# Patient Record
Sex: Male | Born: 1950 | Race: White | Hispanic: No | Marital: Single | Smoking: Former smoker
Health system: Southern US, Community
[De-identification: ages and names within clinical notes are randomized; demographics above are authoritative.]

## PROBLEM LIST (undated history)

## (undated) HISTORY — PX: OTHER SURGICAL HISTORY: SHX169

## (undated) HISTORY — PX: CARPAL TUNNEL RELEASE: SHX101

## (undated) NOTE — *Deleted (*Deleted)
PROGRESS NOTE  John Houston ZOX:[id number] DOB: 25-Jul-1951 DOA: 07/02/2020 PCP: Rodrigo Ran, MD   LOS: 5 days   Brief narrative: As per HPI,  John Houston a69 y.o.malewith a history of prior hospitalization for MRSA and recent covid-19 diagnosis who presented to the ED 07/02/20 with continued progressive dyspnea for 1-2 weeks. On arrival to the ED, SpO2 was 78% on room air, placed on 8L HFNC. CXR revealed multifocal patchy opacities confirmed on CTA chest which showed no PE. Remdesivir and steroids started, and the patient was admitted to [facility]  for acute hypoxic respiratory failure due to covid-19 pneumonia.  Assessment/Plan:  Principal Problem:   Pneumonia due to COVID-19 virus Active Problems:   Leukocytosis   Acute respiratory failure with hypoxia (HCC)   Acute hypoxemic respiratory failure due to covid-19 pneumonia:  Patient tested positive on positive on 10/27, on remdesivir day 5.5.  Continue Solu-Medrol IV high-dose.  Continue baricitinib.  Continue to wean oxygen as able. Continue airborne, contact precautions for 21 days from positive testing.  Continue to monitor inflammatory markers.  Currently on 3 L of oxygen by nasal cannula. Trending down inflammatory biomarkers.  COVID-19 Labs  Recent Labs    07/05/20 0348 07/06/20 0514 07/07/20 0337  DDIMER 0.83* 0.69* 0.58*  FERRITIN 994* 968* 893*  CRP 5.3* 2.8* 1.6*    Lab Results  Component Value Date   SARSCOV2NAA POSITIVE (A) 07/02/2020   SARSCOV2NAA NEGATIVE 11/01/2019    Elevated LFTs.  Mildly elevated.  We will continue to monitor.: Likely due to covid.   Leukocytosis Likely reactive related to steroid use.   Check CBC in a.m.  DVT prophylaxis: enoxaparin (LOVENOX) injection 40 mg Start: 07/03/20 1000    Code Status: Full code  Family Communication: Spoke with the patient's daughter on phone and updated her about the clinical condition of the patient and the plan for disposition home  today.  Status is: Inpatient  Remains inpatient appropriate because:Inpatient level of care appropriate due to severity of illness   Dispo: The patient is from: Home              Anticipated d/c is to: Home              Anticipated d/c date is: 2 days              Patient currently is not medically stable to d/c.       Consultants:  None  Procedures:  None  Antibiotics:  . Remdesivir  Anti-infectives (From admission, onward)   Start     Dose/Rate Route Frequency Ordered Stop   07/04/20 1000  remdesivir 100 mg in sodium chloride 0.9 % 100 mL IVPB       "Followed by" Linked Group Details   100 mg 200 mL/hr over 30 Minutes Intravenous Daily 07/03/20 0001 07/08/20 0959   07/03/20 0100  remdesivir 200 mg in sodium chloride 0.9% 250 mL IVPB       "Followed by" Linked Group Details   200 mg 580 mL/hr over 30 Minutes Intravenous Once 07/03/20 0001 07/03/20 0114       Subjective: Today, patient was seen and examined at bedside. ??  Objective: Vitals:   07/06/20 2131 07/07/20 0533  BP: 130/75 131/86  Pulse: 82 72  Resp: 20 18  Temp: 97.9 F (36.6 C) 98 F (36.7 C)  SpO2: 97% 97%    Intake/Output Summary (Last 24 hours) at 07/07/2020 0829 Last data filed at 07/06/2020 0900 Gross per 24  hour  Intake 240 ml  Output -  Net 240 ml   Filed Weights   07-03-2020 1712  Weight: 79.4 kg   Body mass index is 25.84 kg/m.   Physical Exam: ?? GENERAL: Patient is alert awake and oriented. Not in obvious distress.  HENT: No scleral pallor or icterus. Pupils equally reactive to light. Oral mucosa is moist NECK: is supple, no gross swelling noted. CHEST:  CVS: S1 and S2 heard, no murmur. Regular rate and rhythm.  ABDOMEN: Soft, non-tender, bowel sounds are present. EXTREMITIES: No edema. CNS: Cranial nerves are intact. No focal motor deficits. SKIN: warm and dry without rashes.  Data Review: I have personally reviewed the following laboratory data and studies,   CBC: Recent Labs  Lab 07/03/2020 1736 07/03/20 0031 07/04/20 0327 07/06/20 0514  WBC 17.3* 15.4* 24.3* 23.4*  NEUTROABS 12.4* 10.8* 19.3* 20.0*  HGB 15.2 14.3 14.3 13.0  HCT 44.7 43.3 43.5 38.6*  MCV 94.9 98.6 98.9 98.2  PLT 323 309 311 365   Basic Metabolic Panel: Recent Labs  Lab 07/03/20 0031 07/04/20 0327 07/05/20 0348 07/06/20 0514 07/07/20 0337  NA 136 136 143 140 140  K 4.9 4.4 5.0 4.8 4.7  CL 101 106 109 106 103  CO2 25 19* 24 27 25   GLUCOSE 112* 160* 151* 145* 191*  BUN 19 19 24* 24* 25*  CREATININE 1.23 0.93 0.92 0.91 1.00  CALCIUM 8.7* 8.9 9.2 9.0 9.0   Liver Function Tests: Recent Labs  Lab 07/03/20 0031 07/04/20 0327 07/05/20 0348 07/06/20 0514 07/07/20 0337  AST 52* 49* 41 50* 42*  ALT 73* 84* 93* 120* 118*  ALKPHOS 61 57 48 49 47  BILITOT 1.8* 1.1 1.0 0.9 0.9  PROT 7.0 7.1 6.6 6.5 6.4*  ALBUMIN 3.1* 3.0* 2.8* 2.9* 3.0*   No results for input(s): LIPASE, AMYLASE in the last 168 hours. No results for input(s): AMMONIA in the last 168 hours. Cardiac Enzymes: No results for input(s): CKTOTAL, CKMB, CKMBINDEX, TROPONINI in the last 168 hours. BNP (last 3 results) No results for input(s): BNP in the last 8760 hours.  ProBNP (last 3 results) No results for input(s): PROBNP in the last 8760 hours.  CBG: No results for input(s): GLUCAP in the last 168 hours. Recent Results (from the past 240 hour(s))  Respiratory Panel by RT PCR (Flu A&B, Covid) - Nasopharyngeal Swab     Status: Abnormal   Collection Time: 07-03-2020  5:36 PM   Specimen: Nasopharyngeal Swab  Result Value Ref Range Status   SARS Coronavirus 2 by RT PCR POSITIVE (A) NEGATIVE Final    Comment: RESULT CALLED TO, READ BACK BY AND VERIFIED WITH: April Holding, RN @ [phone] ON July 03, 2020, CABELL[place].P (NOTE) SARS-CoV-2 target nucleic acids are DETECTED.  SARS-CoV-2 RNA is generally detectable in upper respiratory specimens  during the acute phase of infection. Positive results are  indicative of the presence of the identified virus, but do not rule out bacterial infection or co-infection with other pathogens not detected by the test. Clinical correlation with patient history and other diagnostic information is necessary to determine patient infection status. The expected result is Negative.  Fact Sheet for Patients:  https://www.moore.com/  Fact Sheet for Healthcare Providers: https://www.young.biz/  This test is not yet approved or cleared by the [place] FDA and  has been authorized for detection and/or diagnosis of SARS-CoV-2 by FDA under an Emergency Use Authorization (EUA).  This EUA will remain in effect (meaning t his test can be  used) for the duration of  the COVID-19 declaration under Section 564(b)(1) of the Act, 21 U.S.C. section 360bbb-3(b)(1), unless the authorization is terminated or revoked sooner.      Influenza A by PCR NEGATIVE NEGATIVE Final   Influenza B by PCR NEGATIVE NEGATIVE Final    Comment: (NOTE) The Xpert Xpress SARS-CoV-2/FLU/RSV assay is intended as an aid in  the diagnosis of influenza from Nasopharyngeal swab specimens and  should not be used as a sole basis for treatment. Nasal washings and  aspirates are unacceptable for Xpert Xpress SARS-CoV-2/FLU/RSV  testing.  Fact Sheet for Patients: https://www.moore.com/  Fact Sheet for Healthcare Providers: https://www.young.biz/  This test is not yet approved or cleared by the [place] FDA and  has been authorized for detection and/or diagnosis of SARS-CoV-2 by  FDA under an Emergency Use Authorization (EUA). This EUA will remain  in effect (meaning this test can be used) for the duration of the  Covid-19 declaration under Section 564(b)(1) of the Act, 21  U.S.C. section 360bbb-3(b)(1), unless the authorization is  terminated or revoked. Performed at [facility], [address] [place], [place] [address]   Blood Culture (routine x 2)     Status: None (Preliminary result)   Collection Time: 07/02/20  5:37 PM   Specimen: BLOOD  Result Value Ref Range Status   Specimen Description   Final    BLOOD RIGHT ANTECUBITAL Performed at [facility], [address] [place], [place] [address]    Special Requests   Final    BOTTLES DRAWN AEROBIC AND ANAEROBIC Blood Culture adequate volume Performed at [facility], [address] [place], [place] [address]    Culture   Final    NO GROWTH 4 DAYS Performed at [facility], [address], [place], [place] [address]    Report Status PENDING  Incomplete  Blood Culture (routine x 2)     Status: None (Preliminary result)   Collection Time: 07/02/20  5:56 PM   Specimen: BLOOD  Result Value Ref Range Status   Specimen Description   Final    BLOOD BLOOD LEFT FOREARM Performed at [facility], [address] [place], [place] [address]    Special Requests   Final    BOTTLES DRAWN AEROBIC AND ANAEROBIC Blood Culture adequate volume Performed at [facility], [address] [place], [place] [address]    Culture   Final    NO GROWTH 4 DAYS Performed at [facility], [address], [place], [place] [address]    Report Status PENDING  Incomplete     Studies: No results found.    Joycelyn Das, MD  [facility] 07/07/2020

---

## 2006-08-26 ENCOUNTER — Ambulatory Visit (HOSPITAL_BASED_OUTPATIENT_CLINIC_OR_DEPARTMENT_OTHER): Admission: RE | Admit: 2006-08-26 | Discharge: 2006-08-26 | Payer: Self-pay | Admitting: Orthopedic Surgery

## 2007-12-11 ENCOUNTER — Observation Stay (HOSPITAL_COMMUNITY): Admission: EM | Admit: 2007-12-11 | Discharge: 2007-12-12 | Payer: Self-pay | Admitting: Emergency Medicine

## 2011-01-19 NOTE — Discharge Summary (Signed)
John Houston, John Houston              ACCOUNT NO.:  [id number]   MEDICAL RECORD NO.:  [id number]          PATIENT TYPE:  INP   LOCATION:  3731                         FACILITY:  [facility]   PHYSICIAN:  Elmore Guise., M.D.DATE OF BIRTH:  August 26, 1951   DATE OF ADMISSION:  12/11/2007  DATE OF DISCHARGE:  12/12/2007                               DISCHARGE SUMMARY   DISCHARGE DIAGNOSES:  1. Chest pain.  2. Dyslipidemia.  3. Gastroesophageal reflux disease.   HISTORY OF PRESENT ILLNESS:  John Houston is a very pleasant 60 year old  white male who presented with off and on chest pain.   HOSPITAL COURSE:  The patient's hospital course was uncomplicated.  He  ruled out for myocardial infarction and became chest pain-free with the  addition of proton pump inhibitor.  He was noted to have elevated LDL of  147; however, his blood work otherwise was normal.  He is now without  any further chest pain, and he has been up and ambulatory without any  problems.   He will be discharged on the following medications:  1. Aspirin 325 mg daily.  2. Lovastatin 40 mg daily.   He will have followup with Dr. Reyes Ivan at [facility] with an  outpatient stress echo.  If he has any further problems, he is to give  [place] a call for further instructions and evaluation.      Elmore Guise., M.D.  Electronically Signed     TWK/MEDQ  D:  12/12/2007  T:  12/12/2007  Job:  [id number]   cc:   Loraine Leriche A. Perini, M.D.

## 2011-01-19 NOTE — H&P (Signed)
John Houston, John Houston              ACCOUNT NO.:  [id number]   MEDICAL RECORD NO.:  [id number]          PATIENT TYPE:  INP   LOCATION:  1831                         FACILITY:  [facility]   PHYSICIAN:  Elmore Guise., M.D.DATE OF BIRTH:  August 16, 1951   DATE OF ADMISSION:  12/11/2007  DATE OF DISCHARGE:                              HISTORY & PHYSICAL   INDICATION FOR ADMISSION:  Chest pain.   PRIMARY CARE PHYSICIAN:  Dr. Rodrigo Ran.   HISTORY OF PRESENT ILLNESS:  John Houston is a very pleasant 60 year old  white male, past medical history of tobacco use (quit times 1 year),  borderline dyslipidemia, who presents with 3 days of off-and-on chest  pain.  The patient reports his symptoms starting Friday night as  tightness on lying down.  He does report some improvement with activity  that evening; however, his symptoms continued.  He did have off-and-on  symptoms on Saturday and Sunday.  He also reports a nonproductive cough  on Sunday.  He went about his normal activities. However, on Sunday  evening started noticing some radiation to his back and sometimes to the  left arm.  Today, he awoke and just did not feel right.  He also  noticed a little bit of wheezing.  Because of his concerns, he came to  the emergency room for further evaluation.  He denies any nausea,  vomiting or diarrhea.  No fever.  No abdominal pain.  No lower extremity  edema.  No palpitations.  No pre-syncope or syncope.   REVIEW OF SYSTEMS:  As per HPI or otherwise negative.   CURRENT MEDICATIONS:  Are none.   ALLERGIES:  NONE.   FAMILY HISTORY:  Positive for diabetes, liver cancer, and Parkinson's  disease.   SOCIAL HISTORY:  Separated.  He has a history of less than 20 pack-years  of tobacco (quit x1 year), does have occasional alcohol use.   EXAM:  VITAL SIGNS:  He is afebrile.  Blood pressure is 110-120/70,  heart rate 70 to 80s, in normal sinus rhythm.  Sat 95% on room air.  GENERAL:  He is very  pleasant middle-aged white male, alert and oriented  x4.  No acute distress.  HEENT: Is normal.  NECK:  Supple.  No lymphadenopathy, 2+ carotids, no JVD and no bruits.  LUNGS:  Clear with good air movement to the bases.  No wheezes noted.  HEART:  Regular with normal S1-S2, no murmurs, gallops or rubs.  ABDOMEN:  Is soft, nontender, nondistended.  No rebound or guarding.  EXTREMITIES:  Warm with 2+ pulses and no edema.  NEUROLOGICAL:  Cranial nerves are intact and strength is 5/5 and equal  bilaterally.   His blood work shows a white blood cell count of 8.5, hemoglobin of  16.3, platelet count of 141.  He has got a BUN and creatinine of 12 and  1.3 with potassium level 3.5.  His myoglobin is 75, CPK-MB of less than  1.0 and troponin-I less than 0.05.  His ECG shows normal sinus rhythm,  rate of 71 per minute with no significant ST or T-wave changes.  No  change from his prior tracing.  Chest x-ray shows no acute  cardiopulmonary disease.   IMPRESSION:  1. Chest pain with mainly atypical qualities.  2. Borderline dyslipidemia.  3. History of tobacco use.   PLAN:  1. The patient will be admitted for a 24-observation.  We will rule      out myocardial infarction, will start aspirin 325 mg daily.  Start      omeprazole once daily.  He will have Tordol p.r.n. for chest pain.      If his enzymes are negative and he is having no further symptoms,      likely will be able to discharge tomorrow with outpatient stress      echo, done later this week.      Elmore Guise., M.D.  Electronically Signed     TWK/MEDQ  D:  12/11/2007  T:  12/11/2007  Job:  [id number]   cc:   Loraine Leriche A. Perini, M.D.

## 2011-01-22 NOTE — Op Note (Signed)
NAMEDENARD, TUMINELLO              ACCOUNT NO.:  [id number]   MEDICAL RECORD NO.:  [id number]          PATIENT TYPE:  AMB   LOCATION:  DSC                          FACILITY:  [facility]   PHYSICIAN:  Katy Fitch. Sypher, M.D. 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   OPERATION:  Release of right transverse carpal ligament.   SURGEON:  Katy Fitch. Sypher, M.D.   ASSISTANT:  Marveen Reeks. Dasnoit, P.A.-C.   ANESTHESIA:  General by LMA.   SUPERVISING ANESTHESIOLOGIST:  Bedelia Person, M.D.   INDICATIONS:  John Houston is a 60 year old man referred for  evaluation and management of hand numbness.  He is status post a prior  left carpal tunnel release with an excellent result.  He now presents  for release of his right transverse carpal ligament with persistent  discomfort and numbness in the median innervated fingers on the right.  Preoperatively, he was advised of the potential risks and benefits of  surgery.  Questions were invited and answered.   PROCEDURE:  John Houston is brought to the operating room and placed  in supine position on the operating room table.  Following the induction  of general anesthesia by LMA technique, the right arm was prepped with  Betadine soap solution and sterilely draped.  Following exsanguination  of the right arm with an Esmarch bandage, the arterial tourniquet on the  proximal brachium was inflated to 230 mmHg.  The procedure commenced  with a short incision in line of the ring finger and the palm.  The  subcutaneous tissues were carefully divided revealing the palmar fascia.  This was split longitudinally to reveal the common sensory branches of  the median nerve.  These were followed back to the transverse  carpal  ligament which was gently isolated from the median nerve.  The ligament  was then released along its ulnar border extending into the distal  forearm.   This widely opened the carpal canal.  Bleeding points along the margin  of the released ligament were electrocauterized with bipolar current  followed by repair of the skin with intradermal 3-0 Prolene suture.  A  compressive dressing was applied with a volar plaster splint maintaining  the wrist in 5 degrees of dorsiflexion.  There were no apparent  complications.      Katy Fitch Sypher, M.D.  Electronically Signed     RVS/MEDQ  D:  08/26/2006  T:  08/26/2006  Job:  [id number]   cc:   Loraine Leriche A. Perini, M.D.

## 2011-06-01 LAB — DIFFERENTIAL
Basophils Absolute: 0
Basophils Relative: 0
Eosinophils Absolute: 0.2
Lymphs Abs: 2.1
Neutrophils Relative %: 66

## 2011-06-01 LAB — CBC
MCHC: 34.2
MCV: 94.6
Platelets: 141 — ABNORMAL LOW
WBC: 8.5

## 2011-06-01 LAB — POCT CARDIAC MARKERS
CKMB, poc: <1 — ABNORMAL LOW
Myoglobin, poc: 75
Operator id: 151321

## 2011-06-01 LAB — POCT I-STAT, CHEM 8
HCT: 49
Hemoglobin: 16.7
Potassium: 3.5
Sodium: 140

## 2011-06-01 LAB — LIPID PANEL
LDL Cholesterol: 143 — ABNORMAL HIGH
Total CHOL/HDL Ratio: 5.2
Triglycerides: 126
VLDL: 25

## 2012-01-07 ENCOUNTER — Encounter: Payer: Self-pay | Admitting: Internal Medicine

## 2012-03-13 ENCOUNTER — Ambulatory Visit (AMBULATORY_SURGERY_CENTER): Payer: BC Managed Care – PPO | Admitting: *Deleted

## 2012-03-13 VITALS — Ht 70.0 in | Wt 169.0 lb

## 2012-03-13 DIAGNOSIS — Z1211 Encounter for screening for malignant neoplasm of colon: Secondary | ICD-10-CM

## 2012-03-13 MED ORDER — MOVIPREP 100 G PO SOLR: ORAL | Status: DC

## 2012-03-14 ENCOUNTER — Encounter: Payer: Self-pay | Admitting: Internal Medicine

## 2012-03-27 ENCOUNTER — Encounter: Payer: Self-pay | Admitting: Internal Medicine

## 2012-11-17 ENCOUNTER — Encounter (HOSPITAL_BASED_OUTPATIENT_CLINIC_OR_DEPARTMENT_OTHER): Payer: Self-pay | Admitting: *Deleted

## 2012-11-17 ENCOUNTER — Emergency Department (HOSPITAL_BASED_OUTPATIENT_CLINIC_OR_DEPARTMENT_OTHER)
Admission: EM | Admit: 2012-11-17 | Discharge: 2012-11-17 | Disposition: A | Discharge status: Home / Self Care | Payer: BC Managed Care – PPO | Attending: Emergency Medicine | Admitting: Emergency Medicine

## 2012-11-17 DIAGNOSIS — T148XXA Other injury of unspecified body region, initial encounter: Secondary | ICD-10-CM

## 2012-11-17 DIAGNOSIS — IMO0002 Reserved for concepts with insufficient information to code with codable children: Secondary | ICD-10-CM | POA: Insufficient documentation

## 2012-11-17 DIAGNOSIS — Y9389 Activity, other specified: Secondary | ICD-10-CM | POA: Insufficient documentation

## 2012-11-17 DIAGNOSIS — Y92410 Unspecified street and highway as the place of occurrence of the external cause: Secondary | ICD-10-CM | POA: Insufficient documentation

## 2012-11-17 DIAGNOSIS — Z87891 Personal history of nicotine dependence: Secondary | ICD-10-CM | POA: Insufficient documentation

## 2012-11-17 MED ORDER — IBUPROFEN 800 MG PO TABS: 800.0000 mg | ORAL_TABLET | Freq: Three times a day (TID) | ORAL | Status: DC
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
## 2012-11-18 NOTE — ED Provider Notes (Signed)
Medical screening examination/treatment/procedure(s) were performed by non-physician practitioner and as supervising physician I was immediately available for consultation/collaboration.   Alan Davidson III, MD 11/18/12 1239 

## 2012-12-29 ENCOUNTER — Ambulatory Visit (HOSPITAL_COMMUNITY)
Admission: RE | Admit: 2012-12-29 | Discharge: 2012-12-29 | Disposition: A | Discharge status: Home / Self Care | Payer: BC Managed Care – PPO | Source: Ambulatory Visit | Attending: Orthopedic Surgery | Admitting: Orthopedic Surgery

## 2012-12-29 ENCOUNTER — Other Ambulatory Visit (HOSPITAL_COMMUNITY): Payer: Self-pay | Admitting: Orthopedic Surgery

## 2012-12-29 DIAGNOSIS — D140 Benign neoplasm of middle ear, nasal cavity and accessory sinuses: Secondary | ICD-10-CM | POA: Insufficient documentation

## 2012-12-29 DIAGNOSIS — T1590XA Foreign body on external eye, part unspecified, unspecified eye, initial encounter: Secondary | ICD-10-CM | POA: Insufficient documentation

## 2012-12-29 DIAGNOSIS — IMO0002 Reserved for concepts with insufficient information to code with codable children: Secondary | ICD-10-CM

## 2017-04-18 ENCOUNTER — Encounter (HOSPITAL_COMMUNITY): Payer: Self-pay

## 2017-04-18 ENCOUNTER — Emergency Department (HOSPITAL_COMMUNITY): Payer: Medicare Other

## 2017-04-18 ENCOUNTER — Emergency Department (HOSPITAL_COMMUNITY)
Admission: EM | Admit: 2017-04-18 | Discharge: 2017-04-19 | Disposition: A | Discharge status: Home / Self Care | Payer: Medicare Other | Attending: Emergency Medicine | Admitting: Emergency Medicine

## 2017-04-18 DIAGNOSIS — K612 Anorectal abscess: Secondary | ICD-10-CM | POA: Diagnosis not present

## 2017-04-18 DIAGNOSIS — Z87891 Personal history of nicotine dependence: Secondary | ICD-10-CM | POA: Diagnosis not present

## 2017-04-18 DIAGNOSIS — L02215 Cutaneous abscess of perineum: Secondary | ICD-10-CM

## 2017-04-18 DIAGNOSIS — K611 Rectal abscess: Secondary | ICD-10-CM | POA: Diagnosis not present

## 2017-04-18 DIAGNOSIS — Z6825 Body mass index (BMI) 25.0-25.9, adult: Secondary | ICD-10-CM | POA: Diagnosis not present

## 2017-04-18 LAB — COMPREHENSIVE METABOLIC PANEL
ALBUMIN: 4.1 g/dL (ref 3.5–5.0)
ALK PHOS: 58 U/L (ref 38–126)
ALT: 84 U/L — AB (ref 17–63)
AST: 51 U/L — ABNORMAL HIGH (ref 15–41)
Anion gap: 10 (ref 5–15)
BUN: 10 mg/dL (ref 6–20)
CALCIUM: 9.4 mg/dL (ref 8.9–10.3)
CO2: 25 mmol/L (ref 22–32)
CREATININE: 1.07 mg/dL (ref 0.61–1.24)
Chloride: 105 mmol/L (ref 101–111)
GFR calc Af Amer: >60 mL/min (ref >60)
GFR calc non Af Amer: >60 mL/min (ref >60)
GLUCOSE: 114 mg/dL — AB (ref 65–99)
Potassium: 4.3 mmol/L (ref 3.5–5.1)
Sodium: 140 mmol/L (ref 135–145)
Total Bilirubin: 4.5 mg/dL — ABNORMAL HIGH (ref 0.3–1.2)
Total Protein: 7.7 g/dL (ref 6.5–8.1)

## 2017-04-18 LAB — CBC WITH DIFFERENTIAL/PLATELET
BASOS PCT: 0 %
Basophils Absolute: 0 10*3/uL (ref 0.0–0.1)
EOS ABS: 0 10*3/uL (ref 0.0–0.7)
EOS PCT: 0 %
HEMATOCRIT: 44.2 % (ref 39.0–52.0)
Hemoglobin: 15.5 g/dL (ref 13.0–17.0)
Lymphocytes Relative: 11 %
Lymphs Abs: 1.7 10*3/uL (ref 0.7–4.0)
MCH: 32.6 pg (ref 26.0–34.0)
MCHC: 35.1 g/dL (ref 30.0–36.0)
MCV: 92.9 fL (ref 78.0–100.0)
MONO ABS: 1.3 10*3/uL — AB (ref 0.1–1.0)
MONOS PCT: 8 %
Neutro Abs: 11.9 10*3/uL — ABNORMAL HIGH (ref 1.7–7.7)
Neutrophils Relative %: 81 %
Platelets: 157 10*3/uL (ref 150–400)
RBC: 4.76 MIL/uL (ref 4.22–5.81)
RDW: 12.4 % (ref 11.5–15.5)
WBC: 14.9 10*3/uL — ABNORMAL HIGH (ref 4.0–10.5)

## 2017-04-18 LAB — I-STAT CG4 LACTIC ACID, ED: LACTIC ACID, VENOUS: 1.08 mmol/L (ref 0.5–1.9)

## 2017-04-18 MED ORDER — IOPAMIDOL (ISOVUE-300) INJECTION 61%
INTRAVENOUS | Status: AC
  Administered 2017-04-18: 100 mL
  Filled 2017-04-18: qty 100

## 2017-04-18 MED ORDER — SODIUM CHLORIDE 0.9 % IV BOLUS (SEPSIS)
1000.0000 mL | Freq: Once | INTRAVENOUS | Status: AC
Start: 2017-04-18 — End: 2017-04-18
  Administered 2017-04-18: 1000 mL via INTRAVENOUS

## 2017-04-18 NOTE — ED Provider Notes (Signed)
[facility] DEPT Provider Note   CSN: [id number] Arrival date & time: 04/18/17  1307     History   Chief Complaint Chief Complaint  Patient presents with  . Abscess    HPI MAZIAH KEELING is a 66 y.o. male.  Patient with history of intermittent alcohol use, no diabetes -- presents with complaint of perineal pain starting 3 days ago. Patient noted swelling in the rectal area. He went to his PCP today who gave him a shot of Rocephin. He was found to have an elevated white blood cell count. For this, he was sent to the emergency department for further evaluation and treatment. No fevers, nausea or vomiting. No pain with defecation. No urinary symptoms. No history of immunocompromise. The onset of this condition was acute. The course is worsening. Aggravating factors: palpation and sitting. Alleviating factors: ice.        History reviewed. No pertinent past medical history.  There are no active problems to display for this patient.   Past Surgical History:  Procedure Laterality Date  . CARPAL TUNNEL RELEASE  2001 and 2005   bilateral       Home Medications    Prior to Admission medications   Medication Sig Start Date End Date Taking? Authorizing Provider  ibuprofen (ADVIL,MOTRIN) 800 MG tablet Take 1 tablet (800 mg total) by mouth 3 (three) times daily. Patient not taking: Reported on 04/18/2017 11/17/12   Fransico Meadow, PA-C  MOVIPREP Delta take as directed no substitutions Patient not taking: Reported on 04/18/2017 03/13/12   Irene Shipper, MD    Family History No family history on file.  Social History Social History  Substance Use Topics  . Smoking status: Former Research scientist (life sciences)  . Smokeless tobacco: Never Used  . Alcohol use 10.8 oz/week    18 Cans of beer per week     Allergies   Patient has no known allergies.   Review of Systems Review of Systems  Constitutional: Negative for fever.  HENT: Negative for rhinorrhea and sore throat.     Eyes: Negative for redness.  Respiratory: Negative for cough.   Cardiovascular: Negative for chest pain.  Gastrointestinal: Positive for rectal pain. Negative for abdominal pain, blood in stool, diarrhea, nausea and vomiting.  Genitourinary: Negative for dysuria.  Musculoskeletal: Negative for myalgias.  Skin: Negative for rash.  Neurological: Negative for headaches.     Physical Exam Updated Vital Signs BP (!) 152/94 (BP Location: Left Arm)   Pulse 94   Temp 99.1 F (37.3 C) (Oral)   Resp 18   Ht 5' 9.5" (1.765 m)   Wt 77.1 kg (170 lb)   SpO2 100%   BMI 24.74 kg/m   Physical Exam  Constitutional: He appears well-developed and well-nourished.  HENT:  Head: Normocephalic and atraumatic.  Eyes: Conjunctivae are normal. Right eye exhibits no discharge. Left eye exhibits no discharge.  Neck: Normal range of motion. Neck supple.  Cardiovascular: Normal rate, regular rhythm and normal heart sounds.   Pulmonary/Chest: Effort normal and breath sounds normal.  Abdominal: Soft. There is no tenderness.  Genitourinary:  Genitourinary Comments: Patient with R perineal induration, redness, and tenderness, approximately 4-5 cm in length.   Neurological: He is alert.  Skin: Skin is warm and dry.  Psychiatric: He has a normal mood and affect.  Nursing note and vitals reviewed.    ED Treatments / Results  Labs (all labs ordered are listed, but only abnormal results are displayed) Labs Reviewed  COMPREHENSIVE METABOLIC PANEL - Abnormal; Notable for the following:       Result Value   Glucose, Bld 114 (*)    AST 51 (*)    ALT 84 (*)    Total Bilirubin 4.5 (*)    All other components within normal limits  CBC WITH DIFFERENTIAL/PLATELET - Abnormal; Notable for the following:    WBC 14.9 (*)    Neutro Abs 11.9 (*)    Monocytes Absolute 1.3 (*)    All other components within normal limits  I-STAT CG4 LACTIC ACID, ED    EKG  EKG Interpretation None       Radiology Ct  Abdomen Pelvis W Contrast  Result Date: 04/18/2017 CLINICAL DATA:  Abdominal infection.  Right perineal abscess. EXAM: CT ABDOMEN AND PELVIS WITH CONTRAST TECHNIQUE: Multidetector CT imaging of the abdomen and pelvis was performed using the standard protocol following bolus administration of intravenous contrast. CONTRAST:  164mL ISOVUE-300 IOPAMIDOL (ISOVUE-300) INJECTION 61% COMPARISON:  None. FINDINGS: Lower chest: The lung bases are clear.  No pleural fluid. Hepatobiliary: Tiny subcentimeter hypodensity in the anterior left lobe of the liver, too small to characterize. No suspicious lesion. Gallbladder physiologically distended, no calcified stone. No biliary dilatation. Pancreas: No ductal dilatation or inflammation. Spleen: Normal in size without focal abnormality. Adrenals/Urinary Tract: Normal adrenal glands. No hydronephrosis or perinephric edema. Tiny cortical hypodensities in both kidneys are too small to characterize. Homogeneous enhancement with symmetric excretion on delayed phase imaging. Urinary bladder is physiologically distended. Stomach/Bowel: Stomach is within normal limits. Appendix appears normal. No evidence of bowel wall thickening, distention, or inflammatory changes. No bowel wall thickening. No perianal fluid collection. Vascular/Lymphatic: Mild aortic atherosclerosis. No aneurysm. Few prominent bilateral inguinal and right external iliac nodes are likely reactive. Scattered calcified nodes in the pelvis, likely prior granulomatous disease. Reproductive: Prominent prostate gland spans 5 cm. Other: Right perineal skin thickening is only partially included in the lower most field of view. No focal fluid collection were visualized. No intra-abdominal or pelvic fluid collection. No ascites. No free air. Tiny fat containing umbilical hernia. Metallic BB in the left anterior abdominal wall. Musculoskeletal: Degenerative disc disease and facet arthropathy in the lower lumbar spine. There are  no acute or suspicious osseous abnormalities. IMPRESSION: 1. Right perineal skin thickening is only partially included in the field of view. No soft tissue abscess were visualized. No extension into the pelvis. 2. Prominent inguinal and right external iliac nodes are likely reactive. 3. Aortic atherosclerosis. Electronically Signed   By: Jeb Levering M.D.   On: 04/18/2017 21:37    Procedures Procedures (including critical care time)  Medications Ordered in ED Medications  lidocaine-EPINEPHrine (XYLOCAINE W/EPI) 2 %-1:100000 (with pres) injection 20 mL (not administered)  fentaNYL (SUBLIMAZE) injection 50 mcg (not administered)  midazolam (VERSED) injection 2 mg (not administered)  sodium chloride 0.9 % bolus 1,000 mL (0 mLs Intravenous Stopped 04/18/17 2311)  iopamidol (ISOVUE-300) 61 % injection (100 mLs  Contrast Given 04/18/17 2103)     Initial Impression / Assessment and Plan / ED Course  I have reviewed the triage vital signs and the nursing notes.  Pertinent labs & imaging results that were available during my care of the patient were reviewed by me and considered in my medical decision making (see chart for details).     Patient seen and examined. CT ordered to determine extent of abscess.   Vital signs reviewed and are as follows: BP (!) 152/94 (BP Location: Left Arm)   Pulse  94   Temp 99.1 F (37.3 C) (Oral)   Resp 18   Ht 5' 9.5" (1.765 m)   Wt 77.1 kg (170 lb)   SpO2 100%   BMI 24.74 kg/m   Abscess only partially imaged. Dr. Tyrone Nine has seen patient. I spoke with Dr. Hulen Skains who requests that CT be extended to see if fluid collection exists.   1:10 AM Abscess noted. Dr. Hulen Skains to see.   Final Clinical Impressions(s) / ED Diagnoses   Final diagnoses:  Perineal abscess    New Prescriptions New Prescriptions   No medications on file     Carlisle Cater, Hershal Coria 04/19/17 [place], [place], DO 04/22/17 0345

## 2017-04-18 NOTE — ED Notes (Signed)
Pt sent from pcp for perianal abscess. Elevated WBC count on office labs today. Pt was given rocephin IM and referred to ED for surgical consult

## 2017-04-19 DIAGNOSIS — K611 Rectal abscess: Secondary | ICD-10-CM | POA: Diagnosis not present

## 2017-04-19 MED ORDER — LIDOCAINE-EPINEPHRINE 2 %-1:100000 IJ SOLN
20.0000 mL | Freq: Once | INTRAMUSCULAR | Status: AC
  Administered 2017-04-19: 20 mL
  Filled 2017-04-19: qty 20

## 2017-04-19 MED ORDER — SODIUM CHLORIDE 0.9 % IV SOLN
INTRAVENOUS | Status: AC | PRN
  Administered 2017-04-19: 1000 mL via INTRAVENOUS

## 2017-04-19 MED ORDER — MIDAZOLAM HCL 2 MG/2ML IJ SOLN
2.0000 mg | Freq: Once | INTRAMUSCULAR | Status: AC
  Administered 2017-04-19: 2 mg via INTRAVENOUS
  Filled 2017-04-19: qty 2

## 2017-04-19 MED ORDER — AMOXICILLIN-POT CLAVULANATE 875-125 MG PO TABS: 1.0000 | ORAL_TABLET | Freq: Two times a day (BID) | ORAL | 0 refills | Status: AC

## 2017-04-19 MED ORDER — OXYCODONE HCL 5 MG PO TABS: 5.0000 mg | ORAL_TABLET | Freq: Four times a day (QID) | ORAL | 0 refills | Status: DC | PRN

## 2017-04-19 MED ORDER — FENTANYL CITRATE (PF) 100 MCG/2ML IJ SOLN
50.0000 ug | Freq: Once | INTRAMUSCULAR | Status: AC
  Administered 2017-04-19: 25 ug via INTRAVENOUS
  Filled 2017-04-19: qty 2

## 2017-04-19 NOTE — Consult Note (Signed)
Reason for Consult:Perirectal abscess Referring Physician: [name] is an 66 y.o. male.  HPI: Perirectal abscess.needs to be I&D  History reviewed. No pertinent past medical history.  Past Surgical History:  Procedure Laterality Date  . CARPAL TUNNEL RELEASE  2001 and 2005   bilateral    No family history on file.  Social History:  reports that he has quit smoking. He has never used smokeless tobacco. He reports that he drinks about 10.8 oz of alcohol per week . He reports that he does not use drugs.  Allergies: No Known Allergies  Medications: I have reviewed the patient's current medications.  Results for orders placed or performed during the hospital encounter of 04/18/17 (from the past 48 hour(s))  Comprehensive metabolic panel     Status: Abnormal   Collection Time: 04/18/17  2:33 PM  Result Value Ref Range   Sodium 140 135 - 145 mmol/L   Potassium 4.3 3.5 - 5.1 mmol/L   Chloride 105 101 - 111 mmol/L   CO2 25 22 - 32 mmol/L   Glucose, Bld 114 (H) 65 - 99 mg/dL   BUN 10 6 - 20 mg/dL   Creatinine, Ser 1.07 0.61 - 1.24 mg/dL   Calcium 9.4 8.9 - 10.3 mg/dL   Total Protein 7.7 6.5 - 8.1 g/dL   Albumin 4.1 3.5 - 5.0 g/dL   AST 51 (H) 15 - 41 U/L   ALT 84 (H) 17 - 63 U/L   Alkaline Phosphatase 58 38 - 126 U/L   Total Bilirubin 4.5 (H) 0.3 - 1.2 mg/dL   GFR calc non Af Amer >60 >60 mL/min   GFR calc Af Amer >60 >60 mL/min    Comment: (NOTE) The eGFR has been calculated using the CKD EPI equation. This calculation has not been validated in all clinical situations. eGFR's persistently <60 mL/min signify possible Chronic Kidney Disease.    Anion gap 10 5 - 15  CBC with Differential     Status: Abnormal   Collection Time: 04/18/17  2:33 PM  Result Value Ref Range   WBC 14.9 (H) 4.0 - 10.5 K/uL   RBC 4.76 4.22 - 5.81 MIL/uL   Hemoglobin 15.5 13.0 - 17.0 g/dL   HCT 44.2 39.0 - 52.0 %   MCV 92.9 78.0 - 100.0 fL   MCH 32.6 26.0 - 34.0 pg   MCHC 35.1 30.0  - 36.0 g/dL   RDW 12.4 11.5 - 15.5 %   Platelets 157 150 - 400 K/uL   Neutrophils Relative % 81 %   Neutro Abs 11.9 (H) 1.7 - 7.7 K/uL   Lymphocytes Relative 11 %   Lymphs Abs 1.7 0.7 - 4.0 K/uL   Monocytes Relative 8 %   Monocytes Absolute 1.3 (H) 0.1 - 1.0 K/uL   Eosinophils Relative 0 %   Eosinophils Absolute 0.0 0.0 - 0.7 K/uL   Basophils Relative 0 %   Basophils Absolute 0.0 0.0 - 0.1 K/uL  I-Stat CG4 Lactic Acid, ED     Status: None   Collection Time: 04/18/17  2:44 PM  Result Value Ref Range   Lactic Acid, Venous 1.08 0.5 - 1.9 mmol/L    Ct Pelvis W Contrast  Result Date: 04/19/2017 CLINICAL DATA:  Perineal pain. EXAM: CT PELVIS WITH CONTRAST TECHNIQUE: Multidetector CT imaging of the pelvis was performed using the standard protocol following the bolus administration of intravenous contrast. CONTRAST:  60 cc Isovue-300 IV COMPARISON:  Abdominal/ pelvic CT earlier this day.  FINDINGS: Urinary Tract: Excreted intravenous contrast in the ureters and urinary bladder. Bowel:  Pelvic bowel loops are unremarkable. Vascular/Lymphatic: Prominent inguinal and right external iliac nodes. Calcified pelvic nodes are unchanged. No acute vascular abnormality. Reproductive:  Prominent prostate gland. Other: Right perineal fluid collection measures 3.9 x 1.5 x 2.6 cm. No definite connection of the anal canal. There is surrounding soft tissue inflammation. No internal or soft tissue air. Musculoskeletal: Tiny intramuscular lipoma within right vastus musculature. There are no acute or suspicious osseous abnormalities. IMPRESSION: Right perineal abscess measuring 3.9 x 1.5 x 2.6 cm. No definite connection to the anal canal on CT. Electronically Signed   By: Jeb Levering M.D.   On: 04/19/2017 00:40   Ct Abdomen Pelvis W Contrast  Result Date: 04/18/2017 CLINICAL DATA:  Abdominal infection.  Right perineal abscess. EXAM: CT ABDOMEN AND PELVIS WITH CONTRAST TECHNIQUE: Multidetector CT imaging of the  abdomen and pelvis was performed using the standard protocol following bolus administration of intravenous contrast. CONTRAST:  123m ISOVUE-300 IOPAMIDOL (ISOVUE-300) INJECTION 61% COMPARISON:  None. FINDINGS: Lower chest: The lung bases are clear.  No pleural fluid. Hepatobiliary: Tiny subcentimeter hypodensity in the anterior left lobe of the liver, too small to characterize. No suspicious lesion. Gallbladder physiologically distended, no calcified stone. No biliary dilatation. Pancreas: No ductal dilatation or inflammation. Spleen: Normal in size without focal abnormality. Adrenals/Urinary Tract: Normal adrenal glands. No hydronephrosis or perinephric edema. Tiny cortical hypodensities in both kidneys are too small to characterize. Homogeneous enhancement with symmetric excretion on delayed phase imaging. Urinary bladder is physiologically distended. Stomach/Bowel: Stomach is within normal limits. Appendix appears normal. No evidence of bowel wall thickening, distention, or inflammatory changes. No bowel wall thickening. No perianal fluid collection. Vascular/Lymphatic: Mild aortic atherosclerosis. No aneurysm. Few prominent bilateral inguinal and right external iliac nodes are likely reactive. Scattered calcified nodes in the pelvis, likely prior granulomatous disease. Reproductive: Prominent prostate gland spans 5 cm. Other: Right perineal skin thickening is only partially included in the lower most field of view. No focal fluid collection were visualized. No intra-abdominal or pelvic fluid collection. No ascites. No free air. Tiny fat containing umbilical hernia. Metallic BB in the left anterior abdominal wall. Musculoskeletal: Degenerative disc disease and facet arthropathy in the lower lumbar spine. There are no acute or suspicious osseous abnormalities. IMPRESSION: 1. Right perineal skin thickening is only partially included in the field of view. No soft tissue abscess were visualized. No extension into  the pelvis. 2. Prominent inguinal and right external iliac nodes are likely reactive. 3. Aortic atherosclerosis. Electronically Signed   By: MJeb LeveringM.D.   On: 04/18/2017 21:37    Review of Systems  Constitutional: Positive for fever. Negative for chills.  Genitourinary:       Perirectal pain.  All other systems reviewed and are negative.  Blood pressure 134/67, pulse 82, temperature 98 F (36.7 C), resp. rate 20, height 5' 9.5" (1.765 m), weight 77.1 kg (170 lb), SpO2 100 %. Physical Exam  Constitutional: He appears well-developed and well-nourished.  HENT:  Head: Normocephalic and atraumatic.  Eyes: Conjunctivae and EOM are normal.  Cardiovascular: Normal rate, regular rhythm and normal heart sounds.   Respiratory: Effort normal and breath sounds normal.  Genitourinary:       Assessment/Plan: Perirectal abscess for I&D in the ED  Will discharge after I&D and [facility] and reassessment.  John Houston 04/19/2017, 1:34 AM

## 2017-04-19 NOTE — Discharge Instructions (Signed)
Please read and follow all provided instructions.  Your diagnoses today include:  1. Perineal abscess     Tests performed today include:  Vital signs. See below for your results today.   Medications prescribed:   Augmentin - antibiotic  You have been prescribed an antibiotic medicine: take the entire course of medicine even if you are feeling better. Stopping early can cause the antibiotic not to work.   Oxycodone - narcotic pain medication  DO NOT drive or perform any activities that require you to be awake and alert because this medicine can make you drowsy.   Take any prescribed medications only as directed.   Home care instructions:   Follow any educational materials contained in this packet  Follow-up instructions: Follow-up with general surgery per their instructions.  Return instructions:  Return to the Emergency Department if you have:  Fever  Worsening symptoms  Worsening pain  Worsening swelling  Redness of the skin that moves away from the affected area, especially if it streaks away from the affected area   Any other emergent concerns  Your vital signs today were: BP 128/82    Pulse 76    Temp 98 F (36.7 C)    Resp 18    Ht 5' 9.5" (1.765 m)    Wt 77.1 kg (170 lb)    SpO2 99%    BMI 24.74 kg/m  If your blood pressure (BP) was elevated above 135/85 this visit, please have this repeated by your doctor within one month. --------------

## 2017-04-19 NOTE — Progress Notes (Signed)
RT NOTE:  RT called to stand bedside for conscious sedation. AMBU bag available, airway cart outside room. Pt comfortable on 2L O2 throughout procedure. No RT intervention needed.

## 2017-04-19 NOTE — Procedures (Signed)
Incision and Drainage Procedure Note  Pre-operative Diagnosis: Perirectal abscess  Post-operative Diagnosis: same  Indications: Painful, fluctuant perirectal abscess  Anesthesia: 2% lidocaine with epi  Procedure Details  The procedure, risks and complications have been discussed in detail (including, but not limited to airway compromise, infection, bleeding) with the patient, and the patient has signed consent to the procedure.  The skin was sterilely prepped and draped over the perirectal area in the usual fashion. After adequate local anesthesia, I&D with a #11 blade was performed on the right, midline, perirectal several milliliters of. Purulent drainage: present The patient was observed until stable.  Findings: Perirectal abscess with pus  EBL: 5 cc's  Drains: None, but packed with 2 feet of 1/4 inch Iodoform NuGauze  Condition: Tolerated procedure well   Complications: none   Patient is to return to our office at [facility] for packing removal on Thursday.  Kathryne Eriksson. Dahlia Bailiff, MD, [place] [phone] [id number] [facility].

## 2017-04-19 NOTE — Sedation Documentation (Signed)
I&D performed by Dr. Hulen Skains, packing inserted, pt alert and oriented throughout procedure.

## 2017-05-19 DIAGNOSIS — B3789 Other sites of candidiasis: Secondary | ICD-10-CM | POA: Diagnosis not present

## 2017-05-19 DIAGNOSIS — R7301 Impaired fasting glucose: Secondary | ICD-10-CM | POA: Diagnosis not present

## 2017-05-19 DIAGNOSIS — Z6825 Body mass index (BMI) 25.0-25.9, adult: Secondary | ICD-10-CM | POA: Diagnosis not present

## 2017-10-14 DIAGNOSIS — Z125 Encounter for screening for malignant neoplasm of prostate: Secondary | ICD-10-CM | POA: Diagnosis not present

## 2017-10-14 DIAGNOSIS — R7301 Impaired fasting glucose: Secondary | ICD-10-CM | POA: Diagnosis not present

## 2017-10-14 DIAGNOSIS — E7849 Other hyperlipidemia: Secondary | ICD-10-CM | POA: Diagnosis not present

## 2017-10-14 DIAGNOSIS — R82998 Other abnormal findings in urine: Secondary | ICD-10-CM | POA: Diagnosis not present

## 2017-10-21 DIAGNOSIS — Z0000 Encounter for general adult medical examination without abnormal findings: Secondary | ICD-10-CM | POA: Diagnosis not present

## 2017-10-21 DIAGNOSIS — Z23 Encounter for immunization: Secondary | ICD-10-CM | POA: Diagnosis not present

## 2017-10-21 DIAGNOSIS — M542 Cervicalgia: Secondary | ICD-10-CM | POA: Diagnosis not present

## 2017-10-21 DIAGNOSIS — R0789 Other chest pain: Secondary | ICD-10-CM | POA: Diagnosis not present

## 2017-10-21 DIAGNOSIS — R030 Elevated blood-pressure reading, without diagnosis of hypertension: Secondary | ICD-10-CM | POA: Diagnosis not present

## 2017-10-21 DIAGNOSIS — Z1389 Encounter for screening for other disorder: Secondary | ICD-10-CM | POA: Diagnosis not present

## 2017-10-21 DIAGNOSIS — R300 Dysuria: Secondary | ICD-10-CM | POA: Diagnosis not present

## 2017-10-21 DIAGNOSIS — Z6826 Body mass index (BMI) 26.0-26.9, adult: Secondary | ICD-10-CM | POA: Diagnosis not present

## 2017-10-21 DIAGNOSIS — K612 Anorectal abscess: Secondary | ICD-10-CM | POA: Diagnosis not present

## 2017-10-21 DIAGNOSIS — B009 Herpesviral infection, unspecified: Secondary | ICD-10-CM | POA: Diagnosis not present

## 2017-10-21 DIAGNOSIS — F4320 Adjustment disorder, unspecified: Secondary | ICD-10-CM | POA: Diagnosis not present

## 2017-10-21 DIAGNOSIS — R7301 Impaired fasting glucose: Secondary | ICD-10-CM | POA: Diagnosis not present

## 2017-10-21 DIAGNOSIS — M545 Low back pain: Secondary | ICD-10-CM | POA: Diagnosis not present

## 2017-10-27 DIAGNOSIS — Z1212 Encounter for screening for malignant neoplasm of rectum: Secondary | ICD-10-CM | POA: Diagnosis not present

## 2017-11-08 DIAGNOSIS — Z1212 Encounter for screening for malignant neoplasm of rectum: Secondary | ICD-10-CM | POA: Diagnosis not present

## 2017-11-08 DIAGNOSIS — Z1211 Encounter for screening for malignant neoplasm of colon: Secondary | ICD-10-CM | POA: Diagnosis not present

## 2018-05-23 DIAGNOSIS — L02818 Cutaneous abscess of other sites: Secondary | ICD-10-CM | POA: Diagnosis not present

## 2018-09-06 DIAGNOSIS — U071 COVID-19: Secondary | ICD-10-CM

## 2018-09-06 HISTORY — DX: COVID-19: U07.1

## 2018-11-15 DIAGNOSIS — R7301 Impaired fasting glucose: Secondary | ICD-10-CM | POA: Diagnosis not present

## 2018-11-15 DIAGNOSIS — E7849 Other hyperlipidemia: Secondary | ICD-10-CM | POA: Diagnosis not present

## 2018-11-15 DIAGNOSIS — R82998 Other abnormal findings in urine: Secondary | ICD-10-CM | POA: Diagnosis not present

## 2018-11-15 DIAGNOSIS — Z125 Encounter for screening for malignant neoplasm of prostate: Secondary | ICD-10-CM | POA: Diagnosis not present

## 2018-11-23 DIAGNOSIS — M542 Cervicalgia: Secondary | ICD-10-CM | POA: Diagnosis not present

## 2018-11-23 DIAGNOSIS — R030 Elevated blood-pressure reading, without diagnosis of hypertension: Secondary | ICD-10-CM | POA: Diagnosis not present

## 2018-11-23 DIAGNOSIS — M545 Low back pain: Secondary | ICD-10-CM | POA: Diagnosis not present

## 2018-11-23 DIAGNOSIS — Z1331 Encounter for screening for depression: Secondary | ICD-10-CM | POA: Diagnosis not present

## 2018-11-23 DIAGNOSIS — Z0000 Encounter for general adult medical examination without abnormal findings: Secondary | ICD-10-CM | POA: Diagnosis not present

## 2018-11-23 DIAGNOSIS — F4320 Adjustment disorder, unspecified: Secondary | ICD-10-CM | POA: Diagnosis not present

## 2018-11-23 DIAGNOSIS — E7849 Other hyperlipidemia: Secondary | ICD-10-CM | POA: Diagnosis not present

## 2018-11-23 DIAGNOSIS — R7301 Impaired fasting glucose: Secondary | ICD-10-CM | POA: Diagnosis not present

## 2018-11-23 DIAGNOSIS — L988 Other specified disorders of the skin and subcutaneous tissue: Secondary | ICD-10-CM | POA: Diagnosis not present

## 2018-11-23 DIAGNOSIS — Z658 Other specified problems related to psychosocial circumstances: Secondary | ICD-10-CM | POA: Diagnosis not present

## 2018-11-23 DIAGNOSIS — K219 Gastro-esophageal reflux disease without esophagitis: Secondary | ICD-10-CM | POA: Diagnosis not present

## 2018-12-04 ENCOUNTER — Other Ambulatory Visit: Payer: Self-pay | Admitting: Internal Medicine

## 2018-12-04 DIAGNOSIS — E785 Hyperlipidemia, unspecified: Secondary | ICD-10-CM

## 2019-05-15 ENCOUNTER — Ambulatory Visit: Payer: PPO | Admitting: Sports Medicine

## 2019-05-15 ENCOUNTER — Other Ambulatory Visit: Payer: Self-pay

## 2019-05-15 ENCOUNTER — Encounter: Payer: Self-pay | Admitting: Sports Medicine

## 2019-05-15 DIAGNOSIS — L600 Ingrowing nail: Secondary | ICD-10-CM

## 2019-05-15 DIAGNOSIS — M79675 Pain in left toe(s): Secondary | ICD-10-CM

## 2019-05-15 MED ORDER — NEOMYCIN-POLYMYXIN-HC 3.5-10000-1 OT SOLN: OTIC | 0 refills | Status: DC

## 2019-05-15 NOTE — Patient Instructions (Signed)

## 2019-05-15 NOTE — Progress Notes (Signed)
Subjective: John Houston is a 68 y.o. male patient presents to office today complaining of a moderately painful incurvated, red, hot, swollen lateral border of the left great toe for the last 2 weeks.  Patient reports that slowly has been coming more swollen and more red reports initially seeing some purulent drainage but now just significant redness and warmth states that the pain is 7-8 out of 10 throbbing in nature worse with pressure from shoes has tried Neosporin and soaking.  Patient denies nausea vomiting fever chills or any other constitutional symptoms at this time.  Review of Systems  All other systems reviewed and are negative.    There are no active problems to display for this patient.   No current outpatient medications on file prior to visit.   No current facility-administered medications on file prior to visit.     No Known Allergies  Objective:  There were no vitals filed for this visit.  General: Well developed, nourished, in no acute distress, alert and oriented x3   Dermatology: Skin is warm, dry and supple bilateral.  Left hallux nail appears to be  severely incurvated with hyperkeratosis formation at the distal aspects of  the lateral nail border. (+) Erythema. (+) Edema. (-) serosanguous  drainage present. The remaining nails appear unremarkable at this time. There are no open sores, lesions or other signs of infection  present.  Vascular: Dorsalis Pedis artery and Posterior Tibial artery pedal pulses are 2/4 bilateral with immedate capillary fill time. Pedal hair growth present. No lower extremity edema.   Neruologic: Grossly intact via light touch bilateral.  Musculoskeletal: Tenderness to palpation of the left hallux lateral nail fold. Muscular strength within normal limits in all groups bilateral.   Assesement and Plan: Problem List Items Addressed This Visit    None    Visit Diagnoses    Ingrown toenail    -  Primary   Toe pain, left           -Discussed treatment alternatives and plan of care; Explained permanent/temporary nail avulsion and post procedure course to patient. Patient elects for left hallux lateral PNA - After a verbal and written consent, injected 3 ml of a 50:50 mixture of 2% plain  lidocaine and 0.5% plain marcaine in a normal hallux block fashion. Next, a  betadine prep was performed. Anesthesia was tested and found to be appropriate.  The offending left hallux lateral nail border was then incised from the hyponychium to the epinychium. The offending nail border was removed and cleared from the field. The area was curretted for any remaining nail or spicules. Phenol application performed and the area was then flushed with alcohol and dressed with antibiotic cream and a dry sterile dressing. -Patient was instructed to leave the dressing intact for today and begin soaking  in a weak solution of betadine or Epsom salt and water tomorrow. Patient was instructed to  soak for 15-20 minutes each day and apply neosporin/corticosporin and a gauze or bandaid dressing each day. -Patient was instructed to monitor the toe for signs of infection and return to office if toe becomes red, hot or swollen. -Advised ice, elevation, and tylenol or motrin if needed for pain.  -Patient is to return in 2 weeks for follow up care/nail check or sooner if problems arise.  Landis Martins, DPM

## 2019-05-18 ENCOUNTER — Telehealth: Payer: Self-pay | Admitting: Sports Medicine

## 2019-05-18 NOTE — Telephone Encounter (Signed)
I called pt and informed that he may have redness, swelling, oozing and discomfort to varying degrees for 4-6 months and the drops were antibiotic and antiiflammatory, but if he was able to take OTC ibuprofen he could take 2 tablets every 4-6 hours for 1-2 days. Pt states understanding.

## 2019-05-18 NOTE — Telephone Encounter (Signed)
Pt had ingrown procedure with Dr. Cannon Kettle on Tuesday. States toe is still very painful and red. Pt is doing his soaks. Uses [facility].

## 2019-06-05 ENCOUNTER — Ambulatory Visit (INDEPENDENT_AMBULATORY_CARE_PROVIDER_SITE_OTHER): Payer: PPO | Admitting: Sports Medicine

## 2019-06-05 ENCOUNTER — Other Ambulatory Visit: Payer: Self-pay

## 2019-06-05 DIAGNOSIS — M79675 Pain in left toe(s): Secondary | ICD-10-CM

## 2019-06-05 DIAGNOSIS — Z9889 Other specified postprocedural states: Secondary | ICD-10-CM

## 2019-06-06 ENCOUNTER — Encounter: Payer: Self-pay | Admitting: Sports Medicine

## 2019-06-06 NOTE — Progress Notes (Signed)
Subjective: John Houston is a 68 y.o. male patient returns to office today for follow up evaluation after having left hallux lateral nail avulsion performed on 05/15/2019.  Patient reports that he is doing a lot better less redness warmth and swelling no pain has been soaking with Epson salt and applying drops to the area with much improvement.  Patient denies nausea vomiting fever chills or any other constitutional symptoms at this time.  There are no active problems to display for this patient.   Current Outpatient Medications on File Prior to Visit  Medication Sig Dispense Refill  . neomycin-polymyxin-hydrocortisone (CORTISPORIN) OTIC solution Apply 2-3 drops to the ingrown toenail site twice daily. Cover with band-aid. 10 mL 0   No current facility-administered medications on file prior to visit.     No Known Allergies  Objective:  General: Well developed, nourished, in no acute distress, alert and oriented x3   Dermatology: Skin is warm, dry and supple bilateral.  Left hallux lateral nail bed appears to be clean, dry, with mild granular tissue and surrounding eschar/scab. (-) Erythema. (-) Edema. (-) serosanguous drainage present. The remaining nails appear unremarkable at this time. There are no other lesions or other signs of infection  present.  Neurovascular status: Intact. No lower extremity swelling; No pain with calf compression bilateral.  Musculoskeletal: Decreased tenderness to palpation of the left hallux lateral nail fold. Muscular strength within normal limits bilateral.   Assesement and Plan: Problem List Items Addressed This Visit    None    Visit Diagnoses    S/P nail surgery    -  Primary   Toe pain, left          -Examined patient  -Cleansed left hallux lateral nail fold and gently scrubbed with peroxide and q-tip/curetted away eschar at site and applied antibiotic cream covered with bandaid.  -Discussed plan of care with patient. -Patient to now begin  soaking in a weak solution of Epsom salt and warm water. Patient was instructed to soak for 15-20 minutes each day until the toe appears normal and there is no drainage, redness, tenderness, or swelling at the procedure site, and apply neosporin and a gauze or bandaid dressing each day as needed. May leave open to air at night. -Educated patient on long term care after nail surgery. -Patient was instructed to monitor the toe for reoccurrence and signs of infection; Patient advised to return to office or go to ER if toe becomes red, hot or swollen. -Patient is to return as needed or sooner if problems arise.  Landis Martins, DPM

## 2019-09-07 DIAGNOSIS — B9562 Methicillin resistant Staphylococcus aureus infection as the cause of diseases classified elsewhere: Secondary | ICD-10-CM

## 2019-09-07 DIAGNOSIS — R7881 Bacteremia: Secondary | ICD-10-CM

## 2019-09-07 DIAGNOSIS — L600 Ingrowing nail: Secondary | ICD-10-CM

## 2019-09-07 HISTORY — DX: Methicillin resistant Staphylococcus aureus infection as the cause of diseases classified elsewhere: B95.62

## 2019-09-07 HISTORY — PX: TOE SURGERY: SHX1073

## 2019-09-07 HISTORY — DX: Bacteremia: R78.81

## 2019-09-07 HISTORY — DX: Ingrowing nail: L60.0

## 2019-09-11 DIAGNOSIS — R300 Dysuria: Secondary | ICD-10-CM | POA: Diagnosis not present

## 2019-10-08 DIAGNOSIS — N390 Urinary tract infection, site not specified: Secondary | ICD-10-CM

## 2019-10-08 HISTORY — DX: Urinary tract infection, site not specified: N39.0

## 2019-10-29 DIAGNOSIS — N390 Urinary tract infection, site not specified: Secondary | ICD-10-CM | POA: Diagnosis not present

## 2019-10-30 ENCOUNTER — Encounter (HOSPITAL_BASED_OUTPATIENT_CLINIC_OR_DEPARTMENT_OTHER): Payer: Self-pay | Admitting: *Deleted

## 2019-10-30 ENCOUNTER — Emergency Department (HOSPITAL_BASED_OUTPATIENT_CLINIC_OR_DEPARTMENT_OTHER)
Admission: EM | Admit: 2019-10-30 | Discharge: 2019-10-31 | Disposition: A | Discharge status: Home / Self Care | Payer: PPO | Source: Home / Self Care | Attending: Emergency Medicine | Admitting: Emergency Medicine

## 2019-10-30 ENCOUNTER — Other Ambulatory Visit: Payer: Self-pay

## 2019-10-30 ENCOUNTER — Emergency Department (HOSPITAL_BASED_OUTPATIENT_CLINIC_OR_DEPARTMENT_OTHER): Payer: PPO

## 2019-10-30 DIAGNOSIS — A4102 Sepsis due to Methicillin resistant Staphylococcus aureus: Secondary | ICD-10-CM | POA: Diagnosis present

## 2019-10-30 DIAGNOSIS — D72829 Elevated white blood cell count, unspecified: Secondary | ICD-10-CM | POA: Insufficient documentation

## 2019-10-30 DIAGNOSIS — M79672 Pain in left foot: Secondary | ICD-10-CM | POA: Diagnosis not present

## 2019-10-30 DIAGNOSIS — M48061 Spinal stenosis, lumbar region without neurogenic claudication: Secondary | ICD-10-CM | POA: Diagnosis not present

## 2019-10-30 DIAGNOSIS — R9431 Abnormal electrocardiogram [ECG] [EKG]: Secondary | ICD-10-CM | POA: Diagnosis not present

## 2019-10-30 DIAGNOSIS — J9811 Atelectasis: Secondary | ICD-10-CM | POA: Diagnosis not present

## 2019-10-30 DIAGNOSIS — M47816 Spondylosis without myelopathy or radiculopathy, lumbar region: Secondary | ICD-10-CM | POA: Diagnosis not present

## 2019-10-30 DIAGNOSIS — A4902 Methicillin resistant Staphylococcus aureus infection, unspecified site: Secondary | ICD-10-CM | POA: Diagnosis not present

## 2019-10-30 DIAGNOSIS — N3000 Acute cystitis without hematuria: Secondary | ICD-10-CM

## 2019-10-30 DIAGNOSIS — D1771 Benign lipomatous neoplasm of kidney: Secondary | ICD-10-CM | POA: Diagnosis not present

## 2019-10-30 DIAGNOSIS — N400 Enlarged prostate without lower urinary tract symptoms: Secondary | ICD-10-CM | POA: Diagnosis present

## 2019-10-30 DIAGNOSIS — N159 Renal tubulo-interstitial disease, unspecified: Secondary | ICD-10-CM | POA: Diagnosis present

## 2019-10-30 DIAGNOSIS — K219 Gastro-esophageal reflux disease without esophagitis: Secondary | ICD-10-CM | POA: Diagnosis present

## 2019-10-30 DIAGNOSIS — K5900 Constipation, unspecified: Secondary | ICD-10-CM | POA: Diagnosis not present

## 2019-10-30 DIAGNOSIS — R109 Unspecified abdominal pain: Secondary | ICD-10-CM | POA: Diagnosis not present

## 2019-10-30 DIAGNOSIS — Z87891 Personal history of nicotine dependence: Secondary | ICD-10-CM | POA: Diagnosis not present

## 2019-10-30 DIAGNOSIS — N390 Urinary tract infection, site not specified: Secondary | ICD-10-CM | POA: Diagnosis not present

## 2019-10-30 DIAGNOSIS — M5126 Other intervertebral disc displacement, lumbar region: Secondary | ICD-10-CM | POA: Diagnosis not present

## 2019-10-30 DIAGNOSIS — B9562 Methicillin resistant Staphylococcus aureus infection as the cause of diseases classified elsewhere: Secondary | ICD-10-CM | POA: Diagnosis not present

## 2019-10-30 DIAGNOSIS — Z20822 Contact with and (suspected) exposure to covid-19: Secondary | ICD-10-CM | POA: Diagnosis present

## 2019-10-30 DIAGNOSIS — R7881 Bacteremia: Secondary | ICD-10-CM | POA: Diagnosis not present

## 2019-10-30 DIAGNOSIS — M25475 Effusion, left foot: Secondary | ICD-10-CM | POA: Diagnosis not present

## 2019-10-30 DIAGNOSIS — M109 Gout, unspecified: Secondary | ICD-10-CM | POA: Diagnosis present

## 2019-10-30 DIAGNOSIS — K760 Fatty (change of) liver, not elsewhere classified: Secondary | ICD-10-CM | POA: Diagnosis present

## 2019-10-30 DIAGNOSIS — R7301 Impaired fasting glucose: Secondary | ICD-10-CM | POA: Diagnosis present

## 2019-10-30 DIAGNOSIS — M7989 Other specified soft tissue disorders: Secondary | ICD-10-CM | POA: Diagnosis not present

## 2019-10-30 DIAGNOSIS — M549 Dorsalgia, unspecified: Secondary | ICD-10-CM | POA: Diagnosis not present

## 2019-10-30 DIAGNOSIS — Z79899 Other long term (current) drug therapy: Secondary | ICD-10-CM | POA: Diagnosis not present

## 2019-10-30 LAB — CBC
HCT: 45.7 % (ref 39.0–52.0)
Hemoglobin: 15.5 g/dL (ref 13.0–17.0)
MCH: 32.6 pg (ref 26.0–34.0)
MCHC: 33.9 g/dL (ref 30.0–36.0)
MCV: 96.2 fL (ref 80.0–100.0)
Platelets: 199 10*3/uL (ref 150–400)
RBC: 4.75 MIL/uL (ref 4.22–5.81)
RDW: 12.2 % (ref 11.5–15.5)
WBC: 21 10*3/uL — ABNORMAL HIGH (ref 4.0–10.5)
nRBC: 0 % (ref 0.0–0.2)

## 2019-10-30 LAB — URINALYSIS, ROUTINE W REFLEX MICROSCOPIC
Glucose, UA: NEGATIVE mg/dL
Ketones, ur: NEGATIVE mg/dL
Nitrite: POSITIVE — AB
Protein, ur: 100 mg/dL — AB
Specific Gravity, Urine: 1.02 (ref 1.005–1.030)
pH: 6 (ref 5.0–8.0)

## 2019-10-30 LAB — URINALYSIS, MICROSCOPIC (REFLEX): WBC, UA: >50 WBC/hpf (ref 0–5)

## 2019-10-30 LAB — COMPREHENSIVE METABOLIC PANEL
ALT: 56 U/L — ABNORMAL HIGH (ref 0–44)
AST: 43 U/L — ABNORMAL HIGH (ref 15–41)
Albumin: 3.9 g/dL (ref 3.5–5.0)
Alkaline Phosphatase: 90 U/L (ref 38–126)
Anion gap: 12 (ref 5–15)
BUN: 14 mg/dL (ref 8–23)
CO2: 26 mmol/L (ref 22–32)
Calcium: 9.1 mg/dL (ref 8.9–10.3)
Chloride: 99 mmol/L (ref 98–111)
Creatinine, Ser: 1.23 mg/dL (ref 0.61–1.24)
GFR calc Af Amer: >60 mL/min (ref >60)
GFR calc non Af Amer: >60 mL/min (ref >60)
Glucose, Bld: 116 mg/dL — ABNORMAL HIGH (ref 70–99)
Potassium: 3.7 mmol/L (ref 3.5–5.1)
Sodium: 137 mmol/L (ref 135–145)
Total Bilirubin: 2.7 mg/dL — ABNORMAL HIGH (ref 0.3–1.2)
Total Protein: 8.4 g/dL — ABNORMAL HIGH (ref 6.5–8.1)

## 2019-10-30 LAB — LIPASE, BLOOD: Lipase: 28 U/L (ref 11–51)

## 2019-10-30 LAB — LACTIC ACID, PLASMA: Lactic Acid, Venous: 1.2 mmol/L (ref 0.5–1.9)

## 2019-10-30 MED ORDER — SODIUM CHLORIDE 0.9 % IV SOLN: INTRAVENOUS | Status: DC

## 2019-10-30 MED ORDER — SODIUM CHLORIDE 0.9 % IV BOLUS
500.0000 mL | Freq: Once | INTRAVENOUS | Status: AC
  Administered 2019-10-30: 23:00:00 500 mL via INTRAVENOUS

## 2019-10-30 MED ORDER — CIPROFLOXACIN HCL 500 MG PO TABS: 500.0000 mg | ORAL_TABLET | Freq: Two times a day (BID) | ORAL | 0 refills | Status: DC

## 2019-10-30 MED ORDER — HYDROMORPHONE HCL 1 MG/ML IJ SOLN
0.5000 mg | Freq: Once | INTRAMUSCULAR | Status: AC
  Administered 2019-10-30: 23:00:00 0.5 mg via INTRAVENOUS
  Filled 2019-10-30: qty 1

## 2019-10-30 MED ORDER — ONDANSETRON HCL 4 MG/2ML IJ SOLN
4.0000 mg | Freq: Once | INTRAMUSCULAR | Status: AC
  Administered 2019-10-30: 23:00:00 4 mg via INTRAVENOUS
  Filled 2019-10-30: qty 2

## 2019-10-30 MED ORDER — SODIUM CHLORIDE 0.9 % IV SOLN
1.0000 g | Freq: Once | INTRAVENOUS | Status: AC
  Administered 2019-10-30: 1 g via INTRAVENOUS
  Filled 2019-10-30: qty 10

## 2019-10-30 MED ORDER — HYDROCODONE-ACETAMINOPHEN 5-325 MG PO TABS: 1.0000 | ORAL_TABLET | Freq: Four times a day (QID) | ORAL | 0 refills | Status: DC | PRN

## 2019-10-30 NOTE — ED Provider Notes (Signed)
[facility] Provider Note   CSN: [id number] Arrival date & time: 10/30/19  1909     History Chief Complaint  Patient presents with  . Flank Pain    John Houston is a 69 y.o. male.  Patient with a complaint of some left flank pain for a week.  Sent in by his primary care doctor for CT scan to rule out kidney stones.  Patient has had kidney stones before.  Pain is made worse by moving.  Also had some difficulty with some urinary retention earlier in the week.  His doctor started him on Keflex.  After starting the Keflex he was able to urinate.  But he just started the Keflex yesterday.  Denies any fevers but temp is 99.4 here.  No nausea or vomiting.  No blood in the urine.        History reviewed. No pertinent past medical history.  There are no problems to display for this patient.   Past Surgical History:  Procedure Laterality Date  . CARPAL TUNNEL RELEASE  2001 and 2005   bilateral       History reviewed. No pertinent family history.  Social History   Tobacco Use  . Smoking status: Former Research scientist (life sciences)  . Smokeless tobacco: Never Used  Substance Use Topics  . Alcohol use: Yes    Alcohol/week: 18.0 standard drinks    Types: 18 Cans of beer per week  . Drug use: No    Home Medications Prior to Admission medications   Medication Sig Start Date End Date Taking? Authorizing Provider  ciprofloxacin (CIPRO) 500 MG tablet Take 1 tablet (500 mg total) by mouth 2 (two) times daily. 10/30/19   Fredia Sorrow, MD  HYDROcodone-acetaminophen (NORCO/VICODIN) 5-325 MG tablet Take 1-2 tablets by mouth every 6 (six) hours as needed for moderate pain. 10/30/19   Fredia Sorrow, MD  neomycin-polymyxin-hydrocortisone (CORTISPORIN) OTIC solution Apply 2-3 drops to the ingrown toenail site twice daily. Cover with band-aid. 05/15/19   Landis Martins, DPM    Allergies    Patient has no known allergies.  Review of Systems   Review of Systems    Constitutional: Negative for chills and fever.  HENT: Negative for congestion, rhinorrhea and sore throat.   Eyes: Negative for visual disturbance.  Respiratory: Negative for cough and shortness of breath.   Cardiovascular: Negative for chest pain and leg swelling.  Gastrointestinal: Negative for abdominal pain, diarrhea, nausea and vomiting.  Genitourinary: Positive for difficulty urinating and flank pain. Negative for dysuria and hematuria.  Musculoskeletal: Negative for back pain and neck pain.  Skin: Negative for rash.  Neurological: Negative for dizziness, light-headedness and headaches.  Hematological: Does not bruise/bleed easily.  Psychiatric/Behavioral: Negative for confusion.    Physical Exam Updated Vital Signs BP 127/90 (BP Location: Right Arm)   Pulse 85   Temp 99.4 F (37.4 C) (Oral)   Resp 16   Ht 1.765 m (5' 9.5")   Wt 77.1 kg   SpO2 100%   BMI 24.74 kg/m   Physical Exam Vitals and nursing note reviewed.  Constitutional:      General: He is not in acute distress.    Appearance: He is well-developed.  HENT:     Head: Normocephalic and atraumatic.  Eyes:     Conjunctiva/sclera: Conjunctivae normal.     Pupils: Pupils are equal, round, and reactive to light.  Cardiovascular:     Rate and Rhythm: Normal rate and regular rhythm.     Heart  sounds: No murmur.  Pulmonary:     Effort: Pulmonary effort is normal. No respiratory distress.     Breath sounds: Normal breath sounds.  Abdominal:     Palpations: Abdomen is soft.     Tenderness: There is no abdominal tenderness.  Musculoskeletal:        General: Normal range of motion.     Cervical back: Normal range of motion and neck supple.  Skin:    General: Skin is warm and dry.  Neurological:     General: No focal deficit present.     Mental Status: He is alert and oriented to person, place, and time.     Cranial Nerves: No cranial nerve deficit.     Sensory: No sensory deficit.     ED Results /  Procedures / Treatments   Labs (all labs ordered are listed, but only abnormal results are displayed) Labs Reviewed  URINALYSIS, ROUTINE W REFLEX MICROSCOPIC - Abnormal; Notable for the following components:      Result Value   Color, Urine ORANGE (*)    APPearance CLOUDY (*)    Hgb urine dipstick SMALL (*)    Bilirubin Urine SMALL (*)    Protein, ur 100 (*)    Nitrite POSITIVE (*)    Leukocytes,Ua SMALL (*)    All other components within normal limits  CBC - Abnormal; Notable for the following components:   WBC 21.0 (*)    All other components within normal limits  COMPREHENSIVE METABOLIC PANEL - Abnormal; Notable for the following components:   Glucose, Bld 116 (*)    Total Protein 8.4 (*)    AST 43 (*)    ALT 56 (*)    Total Bilirubin 2.7 (*)    All other components within normal limits  URINALYSIS, MICROSCOPIC (REFLEX) - Abnormal; Notable for the following components:   Bacteria, UA FEW (*)    All other components within normal limits  CULTURE, BLOOD (ROUTINE X 2)  CULTURE, BLOOD (ROUTINE X 2)  LIPASE, BLOOD  LACTIC ACID, PLASMA    EKG None  Radiology DG Chest 2 View  Result Date: 10/30/2019 CLINICAL DATA:  Upper abdominal pain EXAM: CHEST - 2 VIEW COMPARISON:  12/11/2007 FINDINGS: Heart size and vascularity normal. Negative for infiltrate or effusion. Minimal atelectasis in the bases. IMPRESSION: No active cardiopulmonary disease. Electronically Signed   By: Franchot Gallo M.D.   On: 10/30/2019 20:53   CT Renal Stone Study  Result Date: 10/30/2019 CLINICAL DATA:  69 year old male with left flank pain. EXAM: CT ABDOMEN AND PELVIS WITHOUT CONTRAST TECHNIQUE: Multidetector CT imaging of the abdomen and pelvis was performed following the standard protocol without IV contrast. COMPARISON:  CT abdomen pelvis dated 04/18/2017. FINDINGS: Evaluation of this exam is limited in the absence of intravenous contrast. Lower chest: Minimal bibasilar densities, likely atelectasis. No  intra-abdominal free air or free fluid. Hepatobiliary: Diffuse fatty infiltration of the liver. Subcentimeter left hepatic hypodense focus is too small to characterize. No intrahepatic biliary ductal dilatation. Probable layering sludge within the gallbladder. No calcified gallstone or pericholecystic fluid. Pancreas: Unremarkable. No pancreatic ductal dilatation or surrounding inflammatory changes. Spleen: Normal in size without focal abnormality. Adrenals/Urinary Tract: The adrenal glands are unremarkable. There is no hydronephrosis or nephrolithiasis on either side. Bilateral perinephric stranding, nonspecific. Correlation with urinalysis recommended to exclude UTI. The visualized ureters appear unremarkable. The urinary bladder is partially distended. There is mild perivesical stranding. Correlation with urinalysis recommended. Stomach/Bowel: There is no bowel obstruction or active  inflammation. The appendix is normal. Vascular/Lymphatic: Mild aortoiliac atherosclerotic disease. The IVC is unremarkable. No portal venous gas. There is no adenopathy. Reproductive: Enlarged prostate gland measuring 6.3 cm in transverse axial diameter. There is faint area of hypodensity in the left aspect of the prostate. Focal prostatitis is not excluded. Clinical correlation is recommended. Other: Small fat containing umbilical hernia. Musculoskeletal: Degenerative changes of the spine. No acute osseous pathology. IMPRESSION: 1. No hydronephrosis or nephrolithiasis. Correlation with urinalysis recommended to evaluate for UTI. 2. No bowel obstruction or active inflammation.  Normal appendix. 3. Fatty liver. 4.  Aortic Atherosclerosis (ICD10-I70.0). Electronically Signed   By: Anner Crete M.D.   On: 10/30/2019 20:06    Procedures Procedures (including critical care time)  Medications Ordered in ED Medications  0.9 %  sodium chloride infusion (has no administration in time range)  cefTRIAXone (ROCEPHIN) 1 g in sodium  chloride 0.9 % 100 mL IVPB (0 g Intravenous Stopped 10/30/19 2320)  sodium chloride 0.9 % bolus 500 mL (0 mLs Intravenous Stopped 10/30/19 2312)  HYDROmorphone (DILAUDID) injection 0.5 mg (0.5 mg Intravenous Given 10/30/19 2316)  ondansetron (ZOFRAN) injection 4 mg (4 mg Intravenous Given 10/30/19 2316)    ED Course  I have reviewed the triage vital signs and the nursing notes.  Pertinent labs & imaging results that were available during my care of the patient were reviewed by me and considered in my medical decision making (see chart for details).    MDM Rules/Calculators/A&P                      Patient's work-up without any evidence of kidney stone.  Radiologist raise some question about UTI.  Patient without true fever here but has marked leukocytosis.  Patient had an elevated white blood cell count in August and differential on that was without any significant abnormalities.  Differential not done on today's CBC.  But feel that he can have it followed up by his primary care doctor 1 to make sure it comes down and they can do a differential on that.  Patient given 1 g of Rocephin here also will add on Cipro to his Keflex that he is already taking is possible urinary tract infection had some partial treatment with the oral Keflex he started yesterday he is definitely urinating better.  Renal function without significant abnormalities.  Lactic acid was not elevated and blood cultures are pending just in case it were to be a prostate infection patient really nontoxic looking better feeling better feel he can be discharged home with close follow-up with his primary care doctor.  Chest x-ray also was negative.  Urine sent for culture as well.  Patient will return for any new or worse symptoms.     Final Clinical Impression(s) / ED Diagnoses Final diagnoses:  Leukocytosis, unspecified type  Acute cystitis without hematuria    Rx / DC Orders ED Discharge Orders         Ordered    ciprofloxacin  (CIPRO) 500 MG tablet  2 times daily     10/30/19 2340    HYDROcodone-acetaminophen (NORCO/VICODIN) 5-325 MG tablet  Every 6 hours PRN     10/30/19 2340           Fredia Sorrow, MD 10/30/19 2347

## 2019-10-30 NOTE — ED Triage Notes (Signed)
Left flank pain x 1 week. Pt sent here for CT scan to r/o kidney stones. Also reports urinary retention-treated with rx (unknown med).

## 2019-10-30 NOTE — Discharge Instructions (Addendum)
No evidence of kidney stone.  Bladder is appear that there could be a urinary tract infection.  Possibly could be prostate infection.  Continue to take your Keflex antibiotic and will add on Cipro take that with until everything is completed.  Prescription sent into your pharmacy for some hydrocodone pain medicine if you need it.  And also the prescription for the Cipro was sent there.  Very important you make an appointment to follow-up with your primary care doctor to have your white blood cell count rechecked.  Here today it was 20,000.  We need to see if it is going to come down.

## 2019-11-01 ENCOUNTER — Emergency Department (HOSPITAL_BASED_OUTPATIENT_CLINIC_OR_DEPARTMENT_OTHER): Payer: PPO

## 2019-11-01 ENCOUNTER — Telehealth (HOSPITAL_BASED_OUTPATIENT_CLINIC_OR_DEPARTMENT_OTHER): Payer: Self-pay | Admitting: Emergency Medicine

## 2019-11-01 ENCOUNTER — Telehealth: Payer: Self-pay | Admitting: Emergency Medicine

## 2019-11-01 ENCOUNTER — Other Ambulatory Visit: Payer: Self-pay

## 2019-11-01 ENCOUNTER — Inpatient Hospital Stay (HOSPITAL_BASED_OUTPATIENT_CLINIC_OR_DEPARTMENT_OTHER)
Admission: EM | Admit: 2019-11-01 | Discharge: 2019-11-06 | DRG: 872 | Disposition: A | Discharge status: Home Health | Payer: PPO | Attending: Internal Medicine | Admitting: Internal Medicine

## 2019-11-01 ENCOUNTER — Inpatient Hospital Stay (HOSPITAL_COMMUNITY): Payer: PPO

## 2019-11-01 ENCOUNTER — Encounter (HOSPITAL_BASED_OUTPATIENT_CLINIC_OR_DEPARTMENT_OTHER): Payer: Self-pay | Admitting: *Deleted

## 2019-11-01 DIAGNOSIS — N400 Enlarged prostate without lower urinary tract symptoms: Secondary | ICD-10-CM | POA: Diagnosis present

## 2019-11-01 DIAGNOSIS — R7881 Bacteremia: Secondary | ICD-10-CM | POA: Diagnosis present

## 2019-11-01 DIAGNOSIS — Z20822 Contact with and (suspected) exposure to covid-19: Secondary | ICD-10-CM | POA: Diagnosis present

## 2019-11-01 DIAGNOSIS — K760 Fatty (change of) liver, not elsewhere classified: Secondary | ICD-10-CM | POA: Diagnosis present

## 2019-11-01 DIAGNOSIS — R109 Unspecified abdominal pain: Secondary | ICD-10-CM

## 2019-11-01 DIAGNOSIS — M79672 Pain in left foot: Secondary | ICD-10-CM | POA: Diagnosis not present

## 2019-11-01 DIAGNOSIS — Z79899 Other long term (current) drug therapy: Secondary | ICD-10-CM

## 2019-11-01 DIAGNOSIS — R7301 Impaired fasting glucose: Secondary | ICD-10-CM | POA: Diagnosis present

## 2019-11-01 DIAGNOSIS — A4102 Sepsis due to Methicillin resistant Staphylococcus aureus: Principal | ICD-10-CM | POA: Diagnosis present

## 2019-11-01 DIAGNOSIS — B9562 Methicillin resistant Staphylococcus aureus infection as the cause of diseases classified elsewhere: Secondary | ICD-10-CM | POA: Diagnosis present

## 2019-11-01 DIAGNOSIS — Z87891 Personal history of nicotine dependence: Secondary | ICD-10-CM | POA: Diagnosis not present

## 2019-11-01 DIAGNOSIS — K219 Gastro-esophageal reflux disease without esophagitis: Secondary | ICD-10-CM | POA: Diagnosis present

## 2019-11-01 DIAGNOSIS — N3000 Acute cystitis without hematuria: Secondary | ICD-10-CM | POA: Diagnosis present

## 2019-11-01 DIAGNOSIS — M109 Gout, unspecified: Secondary | ICD-10-CM | POA: Diagnosis present

## 2019-11-01 DIAGNOSIS — N159 Renal tubulo-interstitial disease, unspecified: Secondary | ICD-10-CM | POA: Diagnosis present

## 2019-11-01 DIAGNOSIS — M549 Dorsalgia, unspecified: Secondary | ICD-10-CM | POA: Diagnosis not present

## 2019-11-01 DIAGNOSIS — K5900 Constipation, unspecified: Secondary | ICD-10-CM | POA: Diagnosis not present

## 2019-11-01 LAB — URINALYSIS, MICROSCOPIC (REFLEX): WBC, UA: >50 WBC/hpf (ref 0–5)

## 2019-11-01 LAB — URINE CULTURE: Culture: <10000 — AB

## 2019-11-01 LAB — CBC WITH DIFFERENTIAL/PLATELET
Abs Immature Granulocytes: 1.82 10*3/uL — ABNORMAL HIGH (ref 0.00–0.07)
Basophils Absolute: 0.1 10*3/uL (ref 0.0–0.1)
Basophils Relative: 0 %
Eosinophils Absolute: 0.1 10*3/uL (ref 0.0–0.5)
Eosinophils Relative: 0 %
HCT: 43.5 % (ref 39.0–52.0)
Hemoglobin: 14.5 g/dL (ref 13.0–17.0)
Immature Granulocytes: 9 %
Lymphocytes Relative: 9 %
Lymphs Abs: 1.9 10*3/uL (ref 0.7–4.0)
MCH: 32.3 pg (ref 26.0–34.0)
MCHC: 33.3 g/dL (ref 30.0–36.0)
MCV: 96.9 fL (ref 80.0–100.0)
Monocytes Absolute: 2.1 10*3/uL — ABNORMAL HIGH (ref 0.1–1.0)
Monocytes Relative: 10 %
Neutro Abs: 15 10*3/uL — ABNORMAL HIGH (ref 1.7–7.7)
Neutrophils Relative %: 72 %
Platelets: 227 10*3/uL (ref 150–400)
RBC: 4.49 MIL/uL (ref 4.22–5.81)
RDW: 12.4 % (ref 11.5–15.5)
Smear Review: NORMAL
WBC: 21 10*3/uL — ABNORMAL HIGH (ref 4.0–10.5)
nRBC: 0 % (ref 0.0–0.2)

## 2019-11-01 LAB — URINALYSIS, ROUTINE W REFLEX MICROSCOPIC
Bilirubin Urine: NEGATIVE
Glucose, UA: NEGATIVE mg/dL
Ketones, ur: NEGATIVE mg/dL
Nitrite: POSITIVE — AB
Protein, ur: NEGATIVE mg/dL
Specific Gravity, Urine: <1.005 — ABNORMAL LOW (ref 1.005–1.030)
pH: 6 (ref 5.0–8.0)

## 2019-11-01 LAB — BLOOD CULTURE ID PANEL (REFLEXED)

## 2019-11-01 LAB — COMPREHENSIVE METABOLIC PANEL
ALT: 79 U/L — ABNORMAL HIGH (ref 0–44)
AST: 55 U/L — ABNORMAL HIGH (ref 15–41)
Albumin: 3.5 g/dL (ref 3.5–5.0)
Alkaline Phosphatase: 104 U/L (ref 38–126)
Anion gap: 10 (ref 5–15)
BUN: 12 mg/dL (ref 8–23)
CO2: 26 mmol/L (ref 22–32)
Calcium: 9.1 mg/dL (ref 8.9–10.3)
Chloride: 101 mmol/L (ref 98–111)
Creatinine, Ser: 1.03 mg/dL (ref 0.61–1.24)
GFR calc Af Amer: >60 mL/min (ref >60)
GFR calc non Af Amer: >60 mL/min (ref >60)
Glucose, Bld: 117 mg/dL — ABNORMAL HIGH (ref 70–99)
Potassium: 3.8 mmol/L (ref 3.5–5.1)
Sodium: 137 mmol/L (ref 135–145)
Total Bilirubin: 2.6 mg/dL — ABNORMAL HIGH (ref 0.3–1.2)
Total Protein: 7.6 g/dL (ref 6.5–8.1)

## 2019-11-01 LAB — SARS CORONAVIRUS 2 (TAT 6-24 HRS): SARS Coronavirus 2: NEGATIVE

## 2019-11-01 LAB — PROTIME-INR
INR: 1.1 (ref 0.8–1.2)
Prothrombin Time: 13.7 seconds (ref 11.4–15.2)

## 2019-11-01 LAB — APTT: aPTT: 29 seconds (ref 24–36)

## 2019-11-01 LAB — LACTIC ACID, PLASMA: Lactic Acid, Venous: 0.9 mmol/L (ref 0.5–1.9)

## 2019-11-01 MED ORDER — SODIUM CHLORIDE 0.9 % IV SOLN: INTRAVENOUS | Status: DC | PRN

## 2019-11-01 MED ORDER — TAMSULOSIN HCL 0.4 MG PO CAPS
0.4000 mg | ORAL_CAPSULE | Freq: Every day | ORAL | Status: DC
  Administered 2019-11-01 – 2019-11-06 (×6): 0.4 mg via ORAL
  Filled 2019-11-01 (×6): qty 1

## 2019-11-01 MED ORDER — KETOROLAC TROMETHAMINE 15 MG/ML IJ SOLN
15.0000 mg | Freq: Four times a day (QID) | INTRAMUSCULAR | Status: DC | PRN
  Administered 2019-11-01: 15 mg via INTRAVENOUS
  Filled 2019-11-01: qty 1

## 2019-11-01 MED ORDER — ONDANSETRON HCL 4 MG/2ML IJ SOLN: 4.0000 mg | Freq: Four times a day (QID) | INTRAMUSCULAR | Status: DC | PRN

## 2019-11-01 MED ORDER — SODIUM CHLORIDE 0.9 % IV SOLN: INTRAVENOUS | Status: DC

## 2019-11-01 MED ORDER — VANCOMYCIN HCL 1500 MG/300ML IV SOLN
1500.0000 mg | INTRAVENOUS | Status: DC
  Filled 2019-11-01: qty 300

## 2019-11-01 MED ORDER — ACETAMINOPHEN 650 MG RE SUPP: 650.0000 mg | Freq: Four times a day (QID) | RECTAL | Status: DC | PRN

## 2019-11-01 MED ORDER — SODIUM CHLORIDE 0.9 % IV BOLUS
1000.0000 mL | Freq: Once | INTRAVENOUS | Status: AC
  Administered 2019-11-01: 1000 mL via INTRAVENOUS

## 2019-11-01 MED ORDER — GADOBUTROL 1 MMOL/ML IV SOLN
8.0000 mL | Freq: Once | INTRAVENOUS | Status: AC | PRN
  Administered 2019-11-01: 8 mL via INTRAVENOUS

## 2019-11-01 MED ORDER — ONDANSETRON HCL 4 MG PO TABS: 4.0000 mg | ORAL_TABLET | Freq: Four times a day (QID) | ORAL | Status: DC | PRN

## 2019-11-01 MED ORDER — ENOXAPARIN SODIUM 40 MG/0.4ML ~~LOC~~ SOLN
40.0000 mg | SUBCUTANEOUS | Status: DC
  Administered 2019-11-01 – 2019-11-05 (×4): 40 mg via SUBCUTANEOUS
  Filled 2019-11-01 (×5): qty 0.4

## 2019-11-01 MED ORDER — ACETAMINOPHEN 325 MG PO TABS
650.0000 mg | ORAL_TABLET | Freq: Four times a day (QID) | ORAL | Status: DC | PRN
  Administered 2019-11-03: 650 mg via ORAL
  Filled 2019-11-01: qty 2

## 2019-11-01 MED ORDER — HYDROCODONE-ACETAMINOPHEN 5-325 MG PO TABS
1.0000 | ORAL_TABLET | Freq: Four times a day (QID) | ORAL | Status: DC | PRN
  Administered 2019-11-01 – 2019-11-02 (×3): 2 via ORAL
  Filled 2019-11-01 (×4): qty 2

## 2019-11-01 MED ORDER — MORPHINE SULFATE (PF) 4 MG/ML IV SOLN
4.0000 mg | Freq: Once | INTRAVENOUS | Status: AC
  Administered 2019-11-01: 4 mg via INTRAVENOUS
  Filled 2019-11-01: qty 1

## 2019-11-01 MED ORDER — VANCOMYCIN HCL IN DEXTROSE 1-5 GM/200ML-% IV SOLN
1000.0000 mg | Freq: Once | INTRAVENOUS | Status: AC
  Administered 2019-11-01: 1000 mg via INTRAVENOUS
  Filled 2019-11-01: qty 200

## 2019-11-01 MED ORDER — COLCHICINE 0.3 MG HALF TABLET
0.3000 mg | ORAL_TABLET | Freq: Every day | ORAL | Status: DC
  Administered 2019-11-01 – 2019-11-02 (×2): 0.3 mg via ORAL
  Filled 2019-11-01 (×2): qty 1

## 2019-11-01 MED ORDER — SENNOSIDES-DOCUSATE SODIUM 8.6-50 MG PO TABS: 1.0000 | ORAL_TABLET | Freq: Every evening | ORAL | Status: DC | PRN

## 2019-11-01 NOTE — ED Notes (Signed)
States he was here earlier this week for flank pain. Reports the next day his L great toe began hurting and he noticed some redness.

## 2019-11-01 NOTE — ED Notes (Signed)
[facility] here to pick up pt  

## 2019-11-01 NOTE — ED Provider Notes (Signed)
[name] EMERGENCY DEPARTMENT Provider Note   CSN: [id number] Arrival date & time: 11/01/19  0915     History Chief Complaint  Patient presents with  . Blood infection    John Houston is a 69 y.o. male.  Patient called back to emergency department after blood culture was positive.  Has been being treated for urinary tract infection.  He has had left flank pain for over a week.  Had unremarkable CT scan here 2 days ago.  Had a ready been on Keflex and was started on ciprofloxacin as well.  Has been feeling generally well but still having intermittent fevers.  Continues to have left flank pain, pain with urination.  Tuesday morning after his emergency room visit he developed some left foot pain and redness there.  Has a history of the same but denies history of gout.  States that he has a history of trauma in this area.  The history is provided by the patient.  Fever Max temp prior to arrival:  101 Temp source:  Oral Severity:  Mild Onset quality:  Gradual Timing:  Intermittent Progression:  Waxing and waning Chronicity:  New Associated symptoms: dysuria   Associated symptoms: no chest pain, no chills, no confusion, no cough, no diarrhea, no ear pain, no nausea, no rash, no sore throat and no vomiting   Risk factors: no immunosuppression and no sick contacts        History reviewed. No pertinent past medical history.  There are no problems to display for this patient.   Past Surgical History:  Procedure Laterality Date  . CARPAL TUNNEL RELEASE  2001 and 2005   bilateral       History reviewed. No pertinent family history.  Social History   Tobacco Use  . Smoking status: Former Research scientist (life sciences)  . Smokeless tobacco: Never Used  Substance Use Topics  . Alcohol use: Yes    Alcohol/week: 18.0 standard drinks    Types: 18 Cans of beer per week  . Drug use: No    Home Medications Prior to Admission medications   Medication Sig Start Date End Date Taking?  Authorizing Provider  ciprofloxacin (CIPRO) 500 MG tablet Take 1 tablet (500 mg total) by mouth 2 (two) times daily. 10/30/19  Yes Fredia Sorrow, MD  HYDROcodone-acetaminophen (NORCO/VICODIN) 5-325 MG tablet Take 1-2 tablets by mouth every 6 (six) hours as needed for moderate pain. 10/30/19   Fredia Sorrow, MD    Allergies    Patient has no known allergies.  Review of Systems   Review of Systems  Constitutional: Positive for fever. Negative for chills.  HENT: Negative for ear pain and sore throat.   Eyes: Negative for pain and visual disturbance.  Respiratory: Negative for cough and shortness of breath.   Cardiovascular: Negative for chest pain and palpitations.  Gastrointestinal: Negative for abdominal distention, abdominal pain, anal bleeding, diarrhea, nausea and vomiting.  Genitourinary: Positive for dysuria, flank pain and frequency. Negative for decreased urine volume, difficulty urinating, discharge, enuresis, genital sores, hematuria, penile pain, penile swelling, scrotal swelling, testicular pain and urgency.  Musculoskeletal: Negative for arthralgias and back pain.  Skin: Positive for color change. Negative for rash.  Neurological: Negative for seizures and syncope.  Psychiatric/Behavioral: Negative for confusion.  All other systems reviewed and are negative.   Physical Exam Updated Vital Signs  ED Triage Vitals  Enc Vitals Group     BP 11/01/19 0923 (!) 137/93     Pulse Rate 11/01/19 0923 92  Resp 11/01/19 0923 18     Temp 11/01/19 0923 99 F (37.2 C)     Temp Source 11/01/19 0923 Oral     SpO2 11/01/19 0923 99 %     Weight --      Height --      Head Circumference --      Peak Flow --      Pain Score 11/01/19 0930 8     Pain Loc --      Pain Edu? --      Excl. in [place]? --     Physical Exam Vitals and nursing note reviewed.  Constitutional:      Appearance: He is well-developed.  HENT:     Head: Normocephalic and atraumatic.     Nose: Nose normal.       Mouth/Throat:     Mouth: Mucous membranes are moist.  Eyes:     Extraocular Movements: Extraocular movements intact.     Conjunctiva/sclera: Conjunctivae normal.     Pupils: Pupils are equal, round, and reactive to light.  Cardiovascular:     Rate and Rhythm: Normal rate and regular rhythm.     Pulses: Normal pulses.     Heart sounds: Normal heart sounds. No murmur.  Pulmonary:     Effort: Pulmonary effort is normal. No respiratory distress.     Breath sounds: Normal breath sounds.  Abdominal:     General: There is no distension.     Palpations: Abdomen is soft.     Tenderness: There is no abdominal tenderness. There is left CVA tenderness.  Musculoskeletal:        General: Tenderness present.     Cervical back: Neck supple.     Right lower leg: No edema.     Left lower leg: No edema.     Comments: Tenderness to left medial foot just above the left toe/MTP with redness in this area  Skin:    General: Skin is warm and dry.     Capillary Refill: Capillary refill takes less than 2 seconds.  Neurological:     General: No focal deficit present.     Mental Status: He is alert.  Psychiatric:        Mood and Affect: Mood normal.     ED Results / Procedures / Treatments   Labs (all labs ordered are listed, but only abnormal results are displayed) Labs Reviewed  COMPREHENSIVE METABOLIC PANEL - Abnormal; Notable for the following components:      Result Value   Glucose, Bld 117 (*)    AST 55 (*)    ALT 79 (*)    Total Bilirubin 2.6 (*)    All other components within normal limits  CBC WITH DIFFERENTIAL/PLATELET - Abnormal; Notable for the following components:   WBC 21.0 (*)    Neutro Abs 15.0 (*)    Monocytes Absolute 2.1 (*)    Abs Immature Granulocytes 1.82 (*)    All other components within normal limits  URINALYSIS, ROUTINE W REFLEX MICROSCOPIC - Abnormal; Notable for the following components:   Specific Gravity, Urine <1.005 (*)    Hgb urine dipstick TRACE (*)     Nitrite POSITIVE (*)    Leukocytes,Ua SMALL (*)    All other components within normal limits  URINALYSIS, MICROSCOPIC (REFLEX) - Abnormal; Notable for the following components:   Bacteria, UA FEW (*)    All other components within normal limits  CULTURE, BLOOD (ROUTINE X 2)  CULTURE, BLOOD (ROUTINE X 2)  URINE CULTURE  SARS CORONAVIRUS 2 (TAT 6-24 HRS)  LACTIC ACID, PLASMA  APTT  PROTIME-INR  LACTIC ACID, PLASMA    EKG EKG Interpretation  Date/Time:  Thursday November 01 2019 10:33:40 EST Ventricular Rate:  74 PR Interval:    QRS Duration: 89 QT Interval:  386 QTC Calculation: 429 R Axis:   49 Text Interpretation: Sinus rhythm Baseline wander in lead(s) V1 V2 Confirmed by Lennice Sites [phone]) on 11/01/2019 10:37:43 AM   Radiology DG Chest 2 View  Result Date: 10/30/2019 CLINICAL DATA:  Upper abdominal pain EXAM: CHEST - 2 VIEW COMPARISON:  12/11/2007 FINDINGS: Heart size and vascularity normal. Negative for infiltrate or effusion. Minimal atelectasis in the bases. IMPRESSION: No active cardiopulmonary disease. Electronically Signed   By: Franchot Gallo M.D.   On: 10/30/2019 20:53   DG Chest Port 1 View  Result Date: 11/01/2019 CLINICAL DATA:  Left flank pain earlier this week, now with positive bacteremia EXAM: PORTABLE CHEST 1 VIEW COMPARISON:  10/30/2019 chest radiograph. FINDINGS: Stable cardiomediastinal silhouette with normal heart size. No pneumothorax. No pleural effusion. No pulmonary edema. No acute consolidative airspace disease. Minimal scarring versus atelectasis at the right lung base. IMPRESSION: Minimal right basilar scarring versus atelectasis. Otherwise no active disease in the chest. Electronically Signed   By: Ilona Sorrel M.D.   On: 11/01/2019 10:29   DG Foot Complete Left  Result Date: 11/01/2019 CLINICAL DATA:  Left foot pain. EXAM: LEFT FOOT - COMPLETE 3+ VIEW COMPARISON:  None. FINDINGS: There is no evidence of fracture or dislocation. Ill-defined soft  tissue calcifications are seen medial to the first metatarsophalangeal joint with focal soft tissue swelling, concerning for possible gout. No other joint abnormality is noted. IMPRESSION: Ill-defined soft tissue calcifications are seen medial to first metatarsophalangeal joint with focal soft tissue swelling, concerning for possible gout. Electronically Signed   By: Marijo Conception M.D.   On: 11/01/2019 10:41   CT Renal Stone Study  Result Date: 10/30/2019 CLINICAL DATA:  69 year old male with left flank pain. EXAM: CT ABDOMEN AND PELVIS WITHOUT CONTRAST TECHNIQUE: Multidetector CT imaging of the abdomen and pelvis was performed following the standard protocol without IV contrast. COMPARISON:  CT abdomen pelvis dated 04/18/2017. FINDINGS: Evaluation of this exam is limited in the absence of intravenous contrast. Lower chest: Minimal bibasilar densities, likely atelectasis. No intra-abdominal free air or free fluid. Hepatobiliary: Diffuse fatty infiltration of the liver. Subcentimeter left hepatic hypodense focus is too small to characterize. No intrahepatic biliary ductal dilatation. Probable layering sludge within the gallbladder. No calcified gallstone or pericholecystic fluid. Pancreas: Unremarkable. No pancreatic ductal dilatation or surrounding inflammatory changes. Spleen: Normal in size without focal abnormality. Adrenals/Urinary Tract: The adrenal glands are unremarkable. There is no hydronephrosis or nephrolithiasis on either side. Bilateral perinephric stranding, nonspecific. Correlation with urinalysis recommended to exclude UTI. The visualized ureters appear unremarkable. The urinary bladder is partially distended. There is mild perivesical stranding. Correlation with urinalysis recommended. Stomach/Bowel: There is no bowel obstruction or active inflammation. The appendix is normal. Vascular/Lymphatic: Mild aortoiliac atherosclerotic disease. The IVC is unremarkable. No portal venous gas. There is  no adenopathy. Reproductive: Enlarged prostate gland measuring 6.3 cm in transverse axial diameter. There is faint area of hypodensity in the left aspect of the prostate. Focal prostatitis is not excluded. Clinical correlation is recommended. Other: Small fat containing umbilical hernia. Musculoskeletal: Degenerative changes of the spine. No acute osseous pathology. IMPRESSION: 1. No hydronephrosis or nephrolithiasis. Correlation with urinalysis recommended to evaluate for UTI.  2. No bowel obstruction or active inflammation.  Normal appendix. 3. Fatty liver. 4.  Aortic Atherosclerosis (ICD10-I70.0). Electronically Signed   By: Anner Crete M.D.   On: 10/30/2019 20:06    Procedures .Critical Care Performed by: Lennice Sites, DO Authorized by: Lennice Sites, DO   Critical care provider statement:    Critical care time (minutes):  35   Critical care was necessary to treat or prevent imminent or life-threatening deterioration of the following conditions:  Sepsis   Critical care was time spent personally by me on the following activities:  Blood draw for specimens, development of treatment plan with patient or surrogate, discussions with primary provider, discussions with consultants, evaluation of patient's response to treatment, examination of patient, obtaining history from patient or surrogate, ordering and performing treatments and interventions, ordering and review of laboratory studies, ordering and review of radiographic studies, pulse oximetry, re-evaluation of patient's condition and review of old charts   I assumed direction of critical care for this patient from another provider in my specialty: no     (including critical care time)  Medications Ordered in ED Medications  vancomycin (VANCOCIN) IVPB 1000 mg/200 mL premix (has no administration in time range)  vancomycin (VANCOCIN) IVPB 1000 mg/200 mL premix (has no administration in time range)  0.9 %  sodium chloride infusion (has no  administration in time range)  sodium chloride 0.9 % bolus 1,000 mL (0 mLs Intravenous Stopped 11/01/19 1059)    ED Course  I have reviewed the triage vital signs and the nursing notes.  Pertinent labs & imaging results that were available during my care of the patient were reviewed by me and considered in my medical decision making (see chart for details).    MDM Rules/Calculators/A&P                      John Houston is a 69 year old male with no significant medical history who presents to the emergency department with positive blood culture.  Patient with low-grade temperature but otherwise normal vitals.  Has been on Keflex and ciprofloxacin for urinary tract infection.  He has been having left flank pain in pain with urination for over a week.  Had a CT scan here several days ago that was unremarkable.  No kidney stones.  Had elevated white count but no fever normal lactic acid.  Did have a blood culture drawn that did come back positive for methicillin-resistant staph.  Talked with infectious disease and they recommend IV vancomycin only.  Urine culture was unremarkable however suspect possibly due to patient having already been on antibiotics.  Urinalysis today shows positive nitrates and small leukocytes.  Suspect ongoing urinary source.  Lactic acid is normal.  White count still elevated at 22.  Chest x-ray without signs of infection.  Patient did develop some left foot pain 2 days ago after this all started.  Suspect likely gout.  States that he has a history of the same but has never had it diagnosed.  Does not have any major joint swelling but does have some redness and focal soft tissue swelling just medial to the first left MTP.  X-ray of this area did not show any fractures but suggest likely gout.  Have lesser concern for septic arthritis given likely urinary source and that symptoms started after blood cultures had already been drawn.  However patient is on IV vancomycin.  Not much  fluid there to aspirate at this time to confirm gout.  Otherwise no significant anemia, electrolyte abnormality.  Patient to admitted to hospitalist for sepsis care in the setting of UTI and positive blood culture.  This chart was dictated using voice recognition software.  Despite best efforts to proofread,  errors can occur which can change the documentation meaning.     Final Clinical Impression(s) / ED Diagnoses Final diagnoses:  Sepsis due to methicillin resistant Staphylococcus aureus (MRSA), unspecified whether acute organ dysfunction present [facility])  Kidney infection    Rx / DC Orders ED Discharge Orders    None       Lennice Sites, DO 11/01/19 1100

## 2019-11-01 NOTE — H&P (Signed)
History and Physical  ROMOLO SILVAN [id number] DOB: 12-05-1950 DOA: 11/01/2019  PCP: Crist Infante, MD Patient coming from: Home   I have personally briefly reviewed patient's old medical records in [place]   Chief Complaint: left side flank pain, left foot pain   HPI: STINSON RASER is a 69 y.o. male with no significant PMH who presents to urgent care 2-23 complaining of flank pain, dysuria, CT renal protocol negative for kidney stone. He was discharge on antibiotics. Blood culture dran at that time came back positive for staph Aureus methicillin resistant. He was advised to present to ED> for IV antibiotics.   He report flank pain persist, he continue to have some difficulty emptying his bladder. He report left foot , big toe pain and redness started last several days.   Evaluation in the ED; WBC at 20, mild transaminases, lactic acid 0.9, UA more than 50 WBC. Chest x ray no active diseases. Left Foot x ray; Ill-defined soft tissue calcifications are seen medial to first metatarsophalangeal joint with focal soft tissue swelling, concerning for possible gout.   Review of Systems: All systems reviewed and apart from history of presenting illness, are negative.  History reviewed. No pertinent past medical history. Past Surgical History:  Procedure Laterality Date  . CARPAL TUNNEL RELEASE  2001 and 2005   bilateral   Social History:  reports that he has quit smoking. He has never used smokeless tobacco. He reports current alcohol use of about 18.0 standard drinks of alcohol per week. He reports that he does not use drugs.   No Known Allergies  Family History; mother had arthritis. Father died car accident. Brother died of tumor.   Prior to Admission medications   Medication Sig Start Date End Date Taking? Authorizing Provider  ciprofloxacin (CIPRO) 500 MG tablet Take 1 tablet (500 mg total) by mouth 2 (two) times daily. 10/30/19  Yes Fredia Sorrow, MD    HYDROcodone-acetaminophen (NORCO/VICODIN) 5-325 MG tablet Take 1-2 tablets by mouth every 6 (six) hours as needed for moderate pain. 10/30/19   Fredia Sorrow, MD   Physical Exam: Vitals:   11/01/19 1445 11/01/19 1500 11/01/19 1527 11/01/19 1609  BP:  129/80 (!) 139/98 (!) 136/94  Pulse: 77 77 94 85  Resp: 16 16 18 18   Temp:    98.8 F (37.1 C)  TempSrc:    Oral  SpO2: 99% 98% 97% 100%     General exam: Moderately built and nourished patient, lying comfortably supine on the gurney in no obvious distress.  Head, eyes and ENT: Nontraumatic and normocephalic. Pupils equally reacting to light and accommodation. Oral mucosa moist.  Neck: Supple. No JVD, carotid bruit or thyromegaly.  Lymphatics: No lymphadenopathy.  Respiratory system: Clear to auscultation. No increased work of breathing.  Cardiovascular system: S1 and S2 heard, RRR. No JVD, murmurs, gallops, clicks or pedal edema.  Gastrointestinal system: Abdomen is nondistended, soft and nontender. Normal bowel sounds heard. No organomegaly or masses appreciated. Flank pain   Central nervous system: Alert and oriented. No focal neurological deficits.  Extremities: Symmetric 5 x 5 power. Peripheral pulses symmetrically felt.   Skin: redness left foot, big toe  Musculoskeletal system: Negative exam.  Psychiatry: Pleasant and cooperative.   Labs on Admission:  Basic Metabolic Panel: Recent Labs  Lab 10/30/19 2023 11/01/19 1008  NA 137 137  K 3.7 3.8  CL 99 101  CO2 26 26  GLUCOSE 116* 117*  BUN 14 12  CREATININE  1.23 1.03  CALCIUM 9.1 9.1   Liver Function Tests: Recent Labs  Lab 10/30/19 2023 11/01/19 1008  AST 43* 55*  ALT 56* 79*  ALKPHOS 90 104  BILITOT 2.7* 2.6*  PROT 8.4* 7.6  ALBUMIN 3.9 3.5   Recent Labs  Lab 10/30/19 2023  LIPASE 28   No results for input(s): AMMONIA in the last 168 hours. CBC: Recent Labs  Lab 10/30/19 2023 11/01/19 1008  WBC 21.0* 21.0*  NEUTROABS  --  15.0*   HGB 15.5 14.5  HCT 45.7 43.5  MCV 96.2 96.9  PLT 199 227   Cardiac Enzymes: No results for input(s): CKTOTAL, CKMB, CKMBINDEX, TROPONINI in the last 168 hours.  BNP (last 3 results) No results for input(s): PROBNP in the last 8760 hours. CBG: No results for input(s): GLUCAP in the last 168 hours.  Radiological Exams on Admission: DG Chest 2 View  Result Date: 10/30/2019 CLINICAL DATA:  Upper abdominal pain EXAM: CHEST - 2 VIEW COMPARISON:  12/11/2007 FINDINGS: Heart size and vascularity normal. Negative for infiltrate or effusion. Minimal atelectasis in the bases. IMPRESSION: No active cardiopulmonary disease. Electronically Signed   By: Franchot Gallo M.D.   On: 10/30/2019 20:53   DG Chest Port 1 View  Result Date: 11/01/2019 CLINICAL DATA:  Left flank pain earlier this week, now with positive bacteremia EXAM: PORTABLE CHEST 1 VIEW COMPARISON:  10/30/2019 chest radiograph. FINDINGS: Stable cardiomediastinal silhouette with normal heart size. No pneumothorax. No pleural effusion. No pulmonary edema. No acute consolidative airspace disease. Minimal scarring versus atelectasis at the right lung base. IMPRESSION: Minimal right basilar scarring versus atelectasis. Otherwise no active disease in the chest. Electronically Signed   By: Ilona Sorrel M.D.   On: 11/01/2019 10:29   DG Foot Complete Left  Result Date: 11/01/2019 CLINICAL DATA:  Left foot pain. EXAM: LEFT FOOT - COMPLETE 3+ VIEW COMPARISON:  None. FINDINGS: There is no evidence of fracture or dislocation. Ill-defined soft tissue calcifications are seen medial to the first metatarsophalangeal joint with focal soft tissue swelling, concerning for possible gout. No other joint abnormality is noted. IMPRESSION: Ill-defined soft tissue calcifications are seen medial to first metatarsophalangeal joint with focal soft tissue swelling, concerning for possible gout. Electronically Signed   By: Marijo Conception M.D.   On: 11/01/2019 10:41   CT  Renal Stone Study  Result Date: 10/30/2019 CLINICAL DATA:  69 year old male with left flank pain. EXAM: CT ABDOMEN AND PELVIS WITHOUT CONTRAST TECHNIQUE: Multidetector CT imaging of the abdomen and pelvis was performed following the standard protocol without IV contrast. COMPARISON:  CT abdomen pelvis dated 04/18/2017. FINDINGS: Evaluation of this exam is limited in the absence of intravenous contrast. Lower chest: Minimal bibasilar densities, likely atelectasis. No intra-abdominal free air or free fluid. Hepatobiliary: Diffuse fatty infiltration of the liver. Subcentimeter left hepatic hypodense focus is too small to characterize. No intrahepatic biliary ductal dilatation. Probable layering sludge within the gallbladder. No calcified gallstone or pericholecystic fluid. Pancreas: Unremarkable. No pancreatic ductal dilatation or surrounding inflammatory changes. Spleen: Normal in size without focal abnormality. Adrenals/Urinary Tract: The adrenal glands are unremarkable. There is no hydronephrosis or nephrolithiasis on either side. Bilateral perinephric stranding, nonspecific. Correlation with urinalysis recommended to exclude UTI. The visualized ureters appear unremarkable. The urinary bladder is partially distended. There is mild perivesical stranding. Correlation with urinalysis recommended. Stomach/Bowel: There is no bowel obstruction or active inflammation. The appendix is normal. Vascular/Lymphatic: Mild aortoiliac atherosclerotic disease. The IVC is unremarkable. No portal venous gas.  There is no adenopathy. Reproductive: Enlarged prostate gland measuring 6.3 cm in transverse axial diameter. There is faint area of hypodensity in the left aspect of the prostate. Focal prostatitis is not excluded. Clinical correlation is recommended. Other: Small fat containing umbilical hernia. Musculoskeletal: Degenerative changes of the spine. No acute osseous pathology. IMPRESSION: 1. No hydronephrosis or nephrolithiasis.  Correlation with urinalysis recommended to evaluate for UTI. 2. No bowel obstruction or active inflammation.  Normal appendix. 3. Fatty liver. 4.  Aortic Atherosclerosis (ICD10-I70.0). Electronically Signed   By: Anner Crete M.D.   On: 10/30/2019 20:06    EKG: sinus rhythem  Assessment/Plan Active Problems:   MRSA bacteremia   1-MRSA Bacteremia; Continue with Vancomycin.  Check MRI left foot.  Check ECHO.  ID consult in am.    2-UTI/ ? Prostate enlargement on CT; Follow urine culture.  Start Flomax Still complaining of flank pain, check Renal US.    3-Leukocytosis; related to Bacteremia  4- Left Foot cellulitis ? Gout.  Check uric acid.  On IV antibiotics.  Check MRI.   DVT Prophylaxis: Lovenox Code Status: Full code Family Communication: care communicated directly to patient.  Disposition Plan: admit for treatment of MRSA bacteremia.   Time spent: 75 minutes.   Elmarie Shiley MD [facility]   11/01/2019, 4:19 PM

## 2019-11-01 NOTE — ED Triage Notes (Signed)
Got a call this morning from a nurse at [place] that patient is positive for Staph infection to his left foot.

## 2019-11-01 NOTE — Progress Notes (Signed)
Notified MD Dahal in regards to patient's vital signs not being abnormal at this time and patient is on Room Air, O2 Sats at 99% per chart. Last Lactic Acid is 0.9. Discussed with MD Dahal with patient needing telemetry at this time and not Stepdown. MD Dahal ordered approval for patient to go to cardiac telemetry.

## 2019-11-01 NOTE — Progress Notes (Signed)
Pharmacy Antibiotic Note  John Houston is a 69 y.o. male admitted on 11/01/2019 with MRSA bacteremia.   Pharmacy has been consulted for vancomycin dosing. WBC 21 and patient afebrile. Noted that patient is now complaining of left foot pain and redness. SCr- 1.03 with CrCl 71 ml/min.   Plan: Vancomycin 2000 mg X 1 Then 1500 mg every 24 hours Predicted AUC 434 with SCr- 1.03  Monitor renal function, repeat blood cultures, ECHO    Temp (24hrs), Avg:99.5 F (37.5 C), Min:99 F (37.2 C), Max:99.9 F (37.7 C)  Recent Labs  Lab 10/30/19 2023 10/30/19 2229 11/01/19 0955 11/01/19 1008  WBC 21.0*  --   --  21.0*  CREATININE 1.23  --   --  1.03  LATICACIDVEN  --  1.2 0.9  --     Estimated Creatinine Clearance: 70.8 mL/min (by C-G formula based on SCr of 1.03 mg/dL).    No Known Allergies  Antimicrobials this admission: 2/25 Vanc>>   Microbiology results: 2/23 BCx: MRSA in 2/2   Thank you for allowing pharmacy to be a part of this patient's care.  Jimmy Footman, PharmD, BCPS, BCIDP Infectious Diseases Clinical Pharmacist Phone: [phone] 11/01/2019 1:45 PM

## 2019-11-01 NOTE — Telephone Encounter (Signed)
Left message on patient's identified voicemail advising him that blood cultures were positive and that he would need to return to the ED for further care.

## 2019-11-02 ENCOUNTER — Inpatient Hospital Stay (HOSPITAL_COMMUNITY): Payer: PPO

## 2019-11-02 DIAGNOSIS — M79672 Pain in left foot: Secondary | ICD-10-CM

## 2019-11-02 DIAGNOSIS — R7881 Bacteremia: Secondary | ICD-10-CM

## 2019-11-02 DIAGNOSIS — Z87891 Personal history of nicotine dependence: Secondary | ICD-10-CM

## 2019-11-02 DIAGNOSIS — K5900 Constipation, unspecified: Secondary | ICD-10-CM

## 2019-11-02 DIAGNOSIS — M549 Dorsalgia, unspecified: Secondary | ICD-10-CM

## 2019-11-02 DIAGNOSIS — B9562 Methicillin resistant Staphylococcus aureus infection as the cause of diseases classified elsewhere: Secondary | ICD-10-CM

## 2019-11-02 DIAGNOSIS — A4102 Sepsis due to Methicillin resistant Staphylococcus aureus: Principal | ICD-10-CM

## 2019-11-02 LAB — COMPREHENSIVE METABOLIC PANEL
ALT: 61 U/L — ABNORMAL HIGH (ref 0–44)
AST: 33 U/L (ref 15–41)
Albumin: 2.9 g/dL — ABNORMAL LOW (ref 3.5–5.0)
Alkaline Phosphatase: 84 U/L (ref 38–126)
Anion gap: 8 (ref 5–15)
BUN: 12 mg/dL (ref 8–23)
CO2: 25 mmol/L (ref 22–32)
Calcium: 8.3 mg/dL — ABNORMAL LOW (ref 8.9–10.3)
Chloride: 106 mmol/L (ref 98–111)
Creatinine, Ser: 0.82 mg/dL (ref 0.61–1.24)
GFR calc Af Amer: >60 mL/min (ref >60)
GFR calc non Af Amer: >60 mL/min (ref >60)
Glucose, Bld: 101 mg/dL — ABNORMAL HIGH (ref 70–99)
Potassium: 3.5 mmol/L (ref 3.5–5.1)
Sodium: 139 mmol/L (ref 135–145)
Total Bilirubin: 2.5 mg/dL — ABNORMAL HIGH (ref 0.3–1.2)
Total Protein: 6.6 g/dL (ref 6.5–8.1)

## 2019-11-02 LAB — CBC
HCT: 38.4 % — ABNORMAL LOW (ref 39.0–52.0)
Hemoglobin: 12.8 g/dL — ABNORMAL LOW (ref 13.0–17.0)
MCH: 32.7 pg (ref 26.0–34.0)
MCHC: 33.3 g/dL (ref 30.0–36.0)
MCV: 98.2 fL (ref 80.0–100.0)
Platelets: 191 10*3/uL (ref 150–400)
RBC: 3.91 MIL/uL — ABNORMAL LOW (ref 4.22–5.81)
RDW: 12.5 % (ref 11.5–15.5)
WBC: 15.8 10*3/uL — ABNORMAL HIGH (ref 4.0–10.5)
nRBC: 0 % (ref 0.0–0.2)

## 2019-11-02 LAB — ECHOCARDIOGRAM COMPLETE

## 2019-11-02 LAB — HIV ANTIBODY (ROUTINE TESTING W REFLEX): HIV Screen 4th Generation wRfx: NONREACTIVE

## 2019-11-02 LAB — URIC ACID: Uric Acid, Serum: 6.2 mg/dL (ref 3.7–8.6)

## 2019-11-02 MED ORDER — VANCOMYCIN HCL IN DEXTROSE 1-5 GM/200ML-% IV SOLN
1000.0000 mg | Freq: Two times a day (BID) | INTRAVENOUS | Status: DC
  Administered 2019-11-02 – 2019-11-06 (×9): 1000 mg via INTRAVENOUS
  Filled 2019-11-02 (×11): qty 200

## 2019-11-02 MED ORDER — GADOBUTROL 1 MMOL/ML IV SOLN
7.0000 mL | Freq: Once | INTRAVENOUS | Status: AC | PRN
  Administered 2019-11-02: 8 mL via INTRAVENOUS

## 2019-11-02 MED ORDER — COLCHICINE 0.3 MG HALF TABLET
0.3000 mg | ORAL_TABLET | Freq: Two times a day (BID) | ORAL | Status: DC
  Administered 2019-11-02 – 2019-11-06 (×8): 0.3 mg via ORAL
  Filled 2019-11-02 (×9): qty 1

## 2019-11-02 NOTE — Progress Notes (Signed)
Pharmacy Antibiotic Note  TAKUYA TOPF is a 69 y.o. male admitted on 11/01/2019 with MRSA bacteremia.   Pharmacy has been consulted for vancomycin dosing. WBC 21 and patient afebrile. Noted that patient is now complaining of left foot pain and redness. SCr- 1.03 with CrCl 71 ml/min.   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 Mild myositis Repeat BCx 2/25 (done- results pending)   Plan: Vancomycin 2 gm loading dose given 2/25 ~ 11 am at [facility] Vancomycin 1000 mg IV Q 12 hrs. Goal AUC 400-550. Expected AUC: 461.1 SCr used: 0.82 F/u TEE, MRI, UCx Will order SS vancomycin levels  Temp (24hrs), Avg:98.7 F (37.1 C), Min:98.2 F (36.8 C), Max:99.2 F (37.3 C)  Recent Labs  Lab 10/30/19 2023 10/30/19 2229 11/01/19 0955 11/01/19 1008 11/02/19 0504  WBC 21.0*  --   --  21.0* 15.8*  CREATININE 1.23  --   --  1.03 0.82  LATICACIDVEN  --  1.2 0.9  --   --     Estimated Creatinine Clearance: 88.9 mL/min (by C-G formula based on SCr of 0.82 mg/dL).    No Known Allergies  Antimicrobials this admission: 2/25 Vanc>> Dose adjustments this admission:  2/26 V 1500 q24>>1 gm q12 for AUC 461 29.6/12.5 w/ SCr 0.82  Microbiology results:  2/23 BCx2: 4/4 bottles MRSA 2/25 BCx2: sent 2/25 I/O UCx: 900 col SA   Thank you for allowing pharmacy to be a part of this patient's care.  Eudelia Bunch, Pharm.D [phone] 11/02/2019 11:26 AM

## 2019-11-02 NOTE — Progress Notes (Signed)
  Echocardiogram 2D Echocardiogram has been performed.  [name] 11/02/2019, 2:37 PM

## 2019-11-02 NOTE — Consult Note (Signed)
[facility]    Date of Admission:  11/01/2019   Total days of antibiotics: 1 vanco               Reason for Consult: MRSA Bacteremia    Referring Provider: CHAMP!, Santami   Assessment: MRSA bacteremia Back Pain   Plan: Needs tee Needs MRI of L-S spine Needs MRI L foot Repeat BCx in AM after being on anbx for clearance.  MRSA isolation  Comment- Unusual case of occult MRSA bacteremia without clear source. Could his foot have seeded his blood and his back?  Thank you so much for this interesting consult,  Active Problems:   MRSA bacteremia   Bacteremia   . colchicine  0.3 mg Oral Daily  . enoxaparin (LOVENOX) injection  40 mg Subcutaneous Q24H  . tamsulosin  0.4 mg Oral Daily    HPI: PASCAL DEY is a 69 y.o. male with hx of visit to ED/[facility] on 2-23 for urinary retention and flank pain for 1 week. He had been started on keflex 2-22 (retention improved). He underwent CT to r/o nephrolithiasis which was notable only for fatty liver and aortic atherosclerosis.  In Ed he had temp to 99.4, WBC 21.0. UA +LE, +Nitr.  He was d/c on cipro/keflex. His UCx is < 10k. He was called to return to ED on 2-25 when his BCx were 4/4 MRSA.  His UCx is 900 colonies Staph aureus.  Today his WBC is 15.8.   C/o back pain. Pain in L foot (ingrown toe nail surgery 04-2019)  Review of Systems: Review of Systems  Constitutional: Positive for fever. Negative for chills.  Eyes: Negative for blurred vision.  Gastrointestinal: Positive for constipation. Negative for diarrhea.  Genitourinary: Negative for dysuria, frequency and urgency.  Please see HPI. All other systems reviewed and negative.   History reviewed. No pertinent past medical history.  Social History   Tobacco Use  . Smoking status: Former Research scientist (life sciences)  . Smokeless tobacco: Never Used  Substance Use Topics  . Alcohol use: Yes    Alcohol/week: 18.0 standard drinks    Types: 18 Cans of  beer per week  . Drug use: No    History reviewed. No pertinent family history.   Medications:  Scheduled: . colchicine  0.3 mg Oral BID  . enoxaparin (LOVENOX) injection  40 mg Subcutaneous Q24H  . tamsulosin  0.4 mg Oral Daily    Abtx:  Anti-infectives (From admission, onward)   Start     Dose/Rate Route Frequency Ordered Stop   11/02/19 1200  vancomycin (VANCOREADY) IVPB 1500 mg/300 mL     1,500 mg 150 mL/hr over 120 Minutes Intravenous Every 24 hours 11/01/19 1349     11/01/19 1200  vancomycin (VANCOCIN) IVPB 1000 mg/200 mL premix     1,000 mg 200 mL/hr over 60 Minutes Intravenous  Once 11/01/19 1043 11/01/19 1526   11/01/19 1045  vancomycin (VANCOCIN) IVPB 1000 mg/200 mL premix     1,000 mg 200 mL/hr over 60 Minutes Intravenous  Once 11/01/19 1042 11/01/19 1210        OBJECTIVE: Blood pressure 133/83, pulse 78, temperature 99.2 F (37.3 C), resp. rate 16, SpO2 97 %.  Physical Exam Vitals reviewed.  Constitutional:      General: He is not in acute distress.    Appearance: Normal appearance. He is not ill-appearing or toxic-appearing.  HENT:     Mouth/Throat:     Mouth:  Mucous membranes are moist.     Pharynx: No oropharyngeal exudate.  Eyes:     Extraocular Movements: Extraocular movements intact.     Pupils: Pupils are equal, round, and reactive to light.  Cardiovascular:     Rate and Rhythm: Normal rate and regular rhythm.  Pulmonary:     Effort: Pulmonary effort is normal.     Breath sounds: Normal breath sounds.  Abdominal:     General: Bowel sounds are normal. There is no distension.     Palpations: Abdomen is soft.     Tenderness: There is no abdominal tenderness.  Musculoskeletal:     Cervical back: Normal range of motion and neck supple.     Right lower leg: No edema.     Left lower leg: No edema.       Feet:  Neurological:     General: No focal deficit present.     Mental Status: He is alert.     Lab Results Results for orders placed  or performed during the hospital encounter of 11/01/19 (from the past 48 hour(s))  Lactic acid, plasma     Status: None   Collection Time: 11/01/19  9:55 AM  Result Value Ref Range   Lactic Acid, Venous 0.9 0.5 - 1.9 mmol/L    Comment: Performed at [facility], [address] [place], [place] [address]  Urinalysis, Routine w reflex microscopic     Status: Abnormal   Collection Time: 11/01/19  9:55 AM  Result Value Ref Range   Color, Urine YELLOW YELLOW   APPearance CLEAR CLEAR   Specific Gravity, Urine <1.005 (L) 1.005 - 1.030   pH 6.0 5.0 - 8.0   Glucose, UA NEGATIVE NEGATIVE mg/dL   Hgb urine dipstick TRACE (A) NEGATIVE   Bilirubin Urine NEGATIVE NEGATIVE   Ketones, ur NEGATIVE NEGATIVE mg/dL   Protein, ur NEGATIVE NEGATIVE mg/dL   Nitrite POSITIVE (A) NEGATIVE   Leukocytes,Ua SMALL (A) NEGATIVE    Comment: Performed at [facility], [address] [place], [place] [address]  Urinalysis, Microscopic (reflex)     Status: Abnormal   Collection Time: 11/01/19  9:55 AM  Result Value Ref Range   RBC / HPF 0-5 0 - 5 RBC/hpf   WBC, UA >50 0 - 5 WBC/hpf   Bacteria, UA FEW (A) NONE SEEN   Squamous Epithelial / LPF 0-5 0 - 5    Comment: Performed at [facility], [address] [place], [place] [address]  Comprehensive metabolic panel     Status: Abnormal   Collection Time: 11/01/19 10:08 AM  Result Value Ref Range   Sodium 137 135 - 145 mmol/L   Potassium 3.8 3.5 - 5.1 mmol/L   Chloride 101 98 - 111 mmol/L   CO2 26 22 - 32 mmol/L   Glucose, Bld 117 (H) 70 - 99 mg/dL    Comment: Glucose reference range applies only to samples taken after fasting for at least 8 hours.   BUN 12 8 - 23 mg/dL   Creatinine, Ser 1.03 0.61 - 1.24 mg/dL   Calcium 9.1 8.9 - 10.3 mg/dL   Total Protein 7.6 6.5 - 8.1 g/dL   Albumin 3.5 3.5 - 5.0 g/dL   AST 55 (H) 15 - 41 U/L   ALT 79 (H) 0 - 44 U/L   Alkaline Phosphatase 104 38 - 126 U/L   Total Bilirubin 2.6 (H) 0.3 -  1.2 mg/dL   GFR  calc non Af Amer >60 >60 mL/min   GFR calc Af Amer >60 >60 mL/min   Anion gap 10 5 - 15    Comment: Performed at [facility], [address] [place], [place] [address]  CBC WITH DIFFERENTIAL     Status: Abnormal   Collection Time: 11/01/19 10:08 AM  Result Value Ref Range   WBC 21.0 (H) 4.0 - 10.5 K/uL   RBC 4.49 4.22 - 5.81 MIL/uL   Hemoglobin 14.5 13.0 - 17.0 g/dL   HCT 43.5 39.0 - 52.0 %   MCV 96.9 80.0 - 100.0 fL   MCH 32.3 26.0 - 34.0 pg   MCHC 33.3 30.0 - 36.0 g/dL   RDW 12.4 11.5 - 15.5 %   Platelets 227 150 - 400 K/uL   nRBC 0.0 0.0 - 0.2 %   Neutrophils Relative % 72 %   Neutro Abs 15.0 (H) 1.7 - 7.7 K/uL   Lymphocytes Relative 9 %   Lymphs Abs 1.9 0.7 - 4.0 K/uL   Monocytes Relative 10 %   Monocytes Absolute 2.1 (H) 0.1 - 1.0 K/uL   Eosinophils Relative 0 %   Eosinophils Absolute 0.1 0.0 - 0.5 K/uL   Basophils Relative 0 %   Basophils Absolute 0.1 0.0 - 0.1 K/uL   WBC Morphology VACUOLATED NEUTROPHILS     Comment: TOXIC GRANULATION MODERATE LEFT SHIFT (>5% METAS AND MYELOS,OCC PRO NOTED) PENDING PATHOLOGIST REVIEW    RBC Morphology MORPHOLOGY UNREMARKABLE    Smear Review Normal platelet morphology    Immature Granulocytes 9 %   Abs Immature Granulocytes 1.82 (H) 0.00 - 0.07 K/uL    Comment: Performed at [facility], [address] [place], [place] [address]  APTT     Status: None   Collection Time: 11/01/19 10:08 AM  Result Value Ref Range   aPTT 29 24 - 36 seconds    Comment: Performed at [facility], [address] [place], [place] [address]  Protime-INR     Status: None   Collection Time: 11/01/19 10:08 AM  Result Value Ref Range   Prothrombin Time 13.7 11.4 - 15.2 seconds   INR 1.1 0.8 - 1.2    Comment: (NOTE) INR goal varies based on device and disease states. Performed at [facility], [address] [place], [place] [address]   Urine culture     Status: Abnormal (Preliminary  result)   Collection Time: 11/01/19 10:09 AM   Specimen: In/Out Cath Urine  Result Value Ref Range   Specimen Description      IN/OUT CATH URINE Performed at [facility], [address] [place], [place] [address]    Special Requests      NONE Performed at [facility], [address] [place], [place] [address]    Culture (A)     900 COLONIES/mL STAPHYLOCOCCUS AUREUS SUSCEPTIBILITIES TO FOLLOW Performed at [facility], [place] [address] [place],  [address]    Report Status PENDING   SARS CORONAVIRUS 2 (TAT 6-24 HRS) Nasopharyngeal Nasopharyngeal Swab     Status: None   Collection Time: 11/01/19 10:55 AM   Specimen: Nasopharyngeal Swab  Result Value Ref Range   SARS Coronavirus 2 NEGATIVE NEGATIVE    Comment: (NOTE) SARS-CoV-2 target nucleic acids are NOT DETECTED. The SARS-CoV-2 RNA is generally detectable in upper and lower respiratory specimens during the acute phase of infection. Negative results do  not preclude SARS-CoV-2 infection, do not rule out co-infections with other pathogens, and should not be used as the sole basis for treatment or other patient management decisions. Negative results must be combined with clinical observations, patient history, and epidemiological information. The expected result is Negative. Fact Sheet for Patients: SugarRoll.be Fact Sheet for Healthcare Providers: https://www.woods-mathews.com/ This test is not yet approved or cleared by the [place] FDA and  has been authorized for detection and/or diagnosis of SARS-CoV-2 by FDA under an Emergency Use Authorization (EUA). This EUA will remain  in effect (meaning this test can be used) for the duration of the COVID-19 declaration under Section 56 4(b)(1) of the Act, 21 U.S.C. section 360bbb-3(b)(1), unless the authorization is terminated or revoked sooner. Performed at [facility], [place] [address],  [place], [place] [address]   Uric acid     Status: None   Collection Time: 11/02/19  5:04 AM  Result Value Ref Range   Uric Acid, Serum 6.2 3.7 - 8.6 mg/dL    Comment: Performed at [facility], [place] [address], [place], [place] [address]  Comprehensive metabolic panel     Status: Abnormal   Collection Time: 11/02/19  5:04 AM  Result Value Ref Range   Sodium 139 135 - 145 mmol/L   Potassium 3.5 3.5 - 5.1 mmol/L   Chloride 106 98 - 111 mmol/L   CO2 25 22 - 32 mmol/L   Glucose, Bld 101 (H) 70 - 99 mg/dL    Comment: Glucose reference range applies only to samples taken after fasting for at least 8 hours.   BUN 12 8 - 23 mg/dL   Creatinine, Ser 0.82 0.61 - 1.24 mg/dL   Calcium 8.3 (L) 8.9 - 10.3 mg/dL   Total Protein 6.6 6.5 - 8.1 g/dL   Albumin 2.9 (L) 3.5 - 5.0 g/dL   AST 33 15 - 41 U/L   ALT 61 (H) 0 - 44 U/L   Alkaline Phosphatase 84 38 - 126 U/L   Total Bilirubin 2.5 (H) 0.3 - 1.2 mg/dL   GFR calc non Af Amer >60 >60 mL/min   GFR calc Af Amer >60 >60 mL/min   Anion gap 8 5 - 15    Comment: Performed at [facility], [place] [address], [place], [place] [address]  CBC     Status: Abnormal   Collection Time: 11/02/19  5:04 AM  Result Value Ref Range   WBC 15.8 (H) 4.0 - 10.5 K/uL   RBC 3.91 (L) 4.22 - 5.81 MIL/uL   Hemoglobin 12.8 (L) 13.0 - 17.0 g/dL   HCT 38.4 (L) 39.0 - 52.0 %   MCV 98.2 80.0 - 100.0 fL   MCH 32.7 26.0 - 34.0 pg   MCHC 33.3 30.0 - 36.0 g/dL   RDW 12.5 11.5 - 15.5 %   Platelets 191 150 - 400 K/uL   nRBC 0.0 0.0 - 0.2 %    Comment: Performed at [facility], [place] [address] [place], [place] [address]      Component Value Date/Time   SDES  11/01/2019 1009    IN/OUT CATH URINE Performed at [facility], [address] [place], [place] [address]    [facility]  11/01/2019 1009    NONE Performed at [facility], [address] [place], [place] [address]    CULT (A) 11/01/2019 1009     900 COLONIES/mL STAPHYLOCOCCUS AUREUS SUSCEPTIBILITIES TO FOLLOW Performed  at [facility], [place] [address], [place], [place] [address]    REPTSTATUS PENDING 11/01/2019 1009   US RENAL  Result Date: 11/02/2019 CLINICAL DATA:  Left flank pain for greater than 2 years EXAM: RENAL / URINARY TRACT ULTRASOUND COMPLETE COMPARISON:  CT renal colic [id number], CT abdomen pelvis April 18, 2017 FINDINGS: Right Kidney: Renal measurements: 11.1 x 6.6 x 5.6 cm = volume: 214.2 mL. Normal cortical echogenicity. Mild renal sinus lipomatosis. No hydronephrosis, shadowing calculi or concerning renal mass. Left Kidney: Renal measurements: 10.5 x 5.2 x 5.0 cm = volume: 143.4 mL. Normal cortical echogenicity. Mild renal sinus lipomatosis. No hydronephrosis, shadowing calculi or concerning renal mass. Bladder: Bladder is largely decompressed at the time of imaging. No gross bladder abnormality is seen for the degree of distention. Other: None IMPRESSION: Mild bilateral renal sinus lipomatosis, nonspecific finding which can be seen with advanced age or diminished renal function. Otherwise unremarkable renal ultrasound. Electronically Signed   By: Lovena Le M.D.   On: 11/02/2019 01:16   MR FOOT LEFT W WO CONTRAST  Result Date: 11/02/2019 CLINICAL DATA:  Foot pain. EXAM: MRI OF THE LEFT FOREFOOT WITHOUT AND WITH CONTRAST TECHNIQUE: Multiplanar, multisequence MR imaging of the left foot was performed both before and after administration of intravenous contrast. CONTRAST:  40mL GADAVIST GADOBUTROL 1 MMOL/ML IV SOLN COMPARISON:  Radiographs 11/01/2019 FINDINGS: There are scattered erosions involving the metatarsal heads and evidence of periarticular soft tissue masses involving the first and fourth MTP joints also demonstrating calcifications on the plain films. There moderate-sized joint effusions are also noted most notably at the first MTP joint. Findings consistent with tophaceous gout. There is also surrounding  soft tissue inflammation and mild myositis. No acute fracture or worrisome bone lesions. The visualized midfoot bony structures are intact and the midfoot joint spaces are maintained. The major tendons and ligaments are intact. IMPRESSION: 1. Findings consistent with tophaceous gout, most notably involving involving the first and fourth MTP joints. Associated moderate-sized joint effusions and surrounding soft tissue inflammation and mild myositis. 2. No acute bony findings. Electronically Signed   By: Marijo Sanes M.D.   On: 11/02/2019 08:10   DG Chest Port 1 View  Result Date: 11/01/2019 CLINICAL DATA:  Left flank pain earlier this week, now with positive bacteremia EXAM: PORTABLE CHEST 1 VIEW COMPARISON:  10/30/2019 chest radiograph. FINDINGS: Stable cardiomediastinal silhouette with normal heart size. No pneumothorax. No pleural effusion. No pulmonary edema. No acute consolidative airspace disease. Minimal scarring versus atelectasis at the right lung base. IMPRESSION: Minimal right basilar scarring versus atelectasis. Otherwise no active disease in the chest. Electronically Signed   By: Ilona Sorrel M.D.   On: 11/01/2019 10:29   DG Foot Complete Left  Result Date: 11/01/2019 CLINICAL DATA:  Left foot pain. EXAM: LEFT FOOT - COMPLETE 3+ VIEW COMPARISON:  None. FINDINGS: There is no evidence of fracture or dislocation. Ill-defined soft tissue calcifications are seen medial to the first metatarsophalangeal joint with focal soft tissue swelling, concerning for possible gout. No other joint abnormality is noted. IMPRESSION: Ill-defined soft tissue calcifications are seen medial to first metatarsophalangeal joint with focal soft tissue swelling, concerning for possible gout. Electronically Signed   By: Marijo Conception M.D.   On: 11/01/2019 10:41   Recent Results (from the past 240 hour(s))  Urine Culture     Status: Abnormal   Collection Time: 10/30/19  8:23 PM   Specimen: Urine, Random  Result Value  Ref Range Status  Specimen Description   Final    URINE, RANDOM Performed at [facility], [address] [place], [place] [address]    Special Requests   Final    NONE Performed at [facility], [address] [place], [place] [address]    Culture (A)  Final    <10,000 COLONIES/mL INSIGNIFICANT GROWTH Performed at [place] [address], [place], [place] [address]    Report Status 11/01/2019 FINAL  Final  Culture, blood (Routine X 2) w Reflex to ID Panel     Status: Abnormal (Preliminary result)   Collection Time: 10/30/19 10:29 PM   Specimen: BLOOD LEFT ARM  Result Value Ref Range Status   Specimen Description   Final    BLOOD LEFT ARM Performed at [facility], [address] [place], [place] [address]    Special Requests   Final    BOTTLES DRAWN AEROBIC AND ANAEROBIC Blood Culture adequate volume Performed at [facility], [address] [place], [place] [address]    Culture  Setup Time   Final    GRAM POSITIVE COCCI IN CLUSTERS IN BOTH AEROBIC AND ANAEROBIC BOTTLES CRITICAL RESULT CALLED TO, READ BACK BY AND VERIFIED WITH: Dahlia Bailiff RN [phone] 11/01/2019 T. TYSOR    Culture (A)  Final    STAPHYLOCOCCUS AUREUS SUSCEPTIBILITIES TO FOLLOW Performed at [facility], [place] [address], [place], [place] [address]    Report Status PENDING  Incomplete  Blood Culture ID Panel (Reflexed)     Status: Abnormal   Collection Time: 10/30/19 10:29 PM  Result Value Ref Range Status   Enterococcus species NOT DETECTED NOT DETECTED Final   Listeria monocytogenes NOT DETECTED NOT DETECTED Final   Staphylococcus species DETECTED (A) NOT DETECTED Final    Comment: CRITICAL RESULT CALLED TO, READ BACK BY AND VERIFIED WITH: Dahlia Bailiff RN [phone] 11/01/2019 T. TYSOR    Staphylococcus aureus (BCID) DETECTED (A) NOT DETECTED Final    Comment: Methicillin (oxacillin)-resistant Staphylococcus aureus (MRSA). MRSA is  predictably resistant to beta-lactam antibiotics (except ceftaroline). Preferred therapy is vancomycin unless clinically contraindicated. Patient requires contact precautions if  hospitalized. CRITICAL RESULT CALLED TO, READ BACK BY AND VERIFIED WITH: Dahlia Bailiff RN [phone] 11/01/2019 T. TYSOR    Methicillin resistance DETECTED (A) NOT DETECTED Final    Comment: CRITICAL RESULT CALLED TO, READ BACK BY AND VERIFIED WITH: Dahlia Bailiff RN [phone] 11/01/2019 T. TYSOR    Streptococcus species NOT DETECTED NOT DETECTED Final   Streptococcus agalactiae NOT DETECTED NOT DETECTED Final   Streptococcus pneumoniae NOT DETECTED NOT DETECTED Final   Streptococcus pyogenes NOT DETECTED NOT DETECTED Final   Acinetobacter baumannii NOT DETECTED NOT DETECTED Final   Enterobacteriaceae species NOT DETECTED NOT DETECTED Final   Enterobacter cloacae complex NOT DETECTED NOT DETECTED Final   Escherichia coli NOT DETECTED NOT DETECTED Final   Klebsiella oxytoca NOT DETECTED NOT DETECTED Final   Klebsiella pneumoniae NOT DETECTED NOT DETECTED Final   Proteus species NOT DETECTED NOT DETECTED Final   Serratia marcescens NOT DETECTED NOT DETECTED Final   Haemophilus influenzae NOT DETECTED NOT DETECTED Final   Neisseria meningitidis NOT DETECTED NOT DETECTED Final   Pseudomonas aeruginosa NOT DETECTED NOT DETECTED Final   Candida albicans NOT DETECTED NOT DETECTED Final   Candida glabrata NOT DETECTED NOT DETECTED Final   Candida krusei NOT DETECTED NOT DETECTED Final   Candida parapsilosis NOT DETECTED NOT DETECTED Final  Candida tropicalis NOT DETECTED NOT DETECTED Final    Comment: Performed at [facility], [place] [address] [place], [place] [address]  Culture, blood (Routine X 2) w Reflex to ID Panel     Status: Abnormal (Preliminary result)   Collection Time: 10/30/19 10:36 PM   Specimen: BLOOD RIGHT ARM  Result Value Ref Range Status   Specimen Description   Final    BLOOD RIGHT  ARM Performed at [facility], [address] [place], [place] [address]    Special Requests   Final    BOTTLES DRAWN AEROBIC AND ANAEROBIC Blood Culture adequate volume Performed at [facility], [address] [place], [place] [address]    Culture  Setup Time   Final    IN BOTH AEROBIC AND ANAEROBIC BOTTLES GRAM POSITIVE COCCI IN CLUSTERS CRITICAL RESULT CALLED TO, READ BACK BY AND VERIFIED WITH: Jennye Boroughs RN 10/31/19 2153 JDW Performed at [facility], [place] [address], [place], [place] [address]    Culture STAPHYLOCOCCUS AUREUS (A)  Final   Report Status PENDING  Incomplete  Urine culture     Status: Abnormal (Preliminary result)   Collection Time: 11/01/19 10:09 AM   Specimen: In/Out Cath Urine  Result Value Ref Range Status   Specimen Description   Final    IN/OUT CATH URINE Performed at [facility], [address] [place], [place] [address]    Special Requests   Final    NONE Performed at [facility], [address] [place], [place] [address]    Culture (A)  Final    900 COLONIES/mL STAPHYLOCOCCUS AUREUS SUSCEPTIBILITIES TO FOLLOW Performed at [facility], [place] [address], [place], [place] [address]    Report Status PENDING  Incomplete  SARS CORONAVIRUS 2 (TAT 6-24 HRS) Nasopharyngeal Nasopharyngeal Swab     Status: None   Collection Time: 11/01/19 10:55 AM   Specimen: Nasopharyngeal Swab  Result Value Ref Range Status   SARS Coronavirus 2 NEGATIVE NEGATIVE Final    Comment: (NOTE) SARS-CoV-2 target nucleic acids are NOT DETECTED. The SARS-CoV-2 RNA is generally detectable in upper and lower respiratory specimens during the acute phase of infection. Negative results do not preclude SARS-CoV-2 infection, do not rule out co-infections with other pathogens, and should not be used as the sole basis for treatment or other patient management decisions. Negative results must be combined with clinical  observations, patient history, and epidemiological information. The expected result is Negative. Fact Sheet for Patients: SugarRoll.be Fact Sheet for Healthcare Providers: https://www.woods-mathews.com/ This test is not yet approved or cleared by the [place] FDA and  has been authorized for detection and/or diagnosis of SARS-CoV-2 by FDA under an Emergency Use Authorization (EUA). This EUA will remain  in effect (meaning this test can be used) for the duration of the COVID-19 declaration under Section 56 4(b)(1) of the Act, 21 U.S.C. section 360bbb-3(b)(1), unless the authorization is terminated or revoked sooner. Performed at [facility], [place] [address] [place], [place] [address]     Microbiology: Recent Results (from the past 240 hour(s))  Urine Culture     Status: Abnormal   Collection Time: 10/30/19  8:23 PM   Specimen: Urine, Random  Result Value Ref Range Status   Specimen Description   Final    URINE, RANDOM Performed at [facility], [address] [place], [place] [address]  Special Requests   Final    NONE Performed at [facility], [address] [place], [place] [address]    Culture (A)  Final    <10,000 COLONIES/mL INSIGNIFICANT GROWTH Performed at [place] [address], [place], [place] [address]    Report Status 11/01/2019 FINAL  Final  Culture, blood (Routine X 2) w Reflex to ID Panel     Status: Abnormal (Preliminary result)   Collection Time: 10/30/19 10:29 PM   Specimen: BLOOD LEFT ARM  Result Value Ref Range Status   Specimen Description   Final    BLOOD LEFT ARM Performed at [facility], [address] [place], [place] [address]    Special Requests   Final    BOTTLES DRAWN AEROBIC AND ANAEROBIC Blood Culture adequate volume Performed at [facility], [address] [place], [place] [address]    Culture  Setup Time   Final    GRAM  POSITIVE COCCI IN CLUSTERS IN BOTH AEROBIC AND ANAEROBIC BOTTLES CRITICAL RESULT CALLED TO, READ BACK BY AND VERIFIED WITH: Dahlia Bailiff RN [phone] 11/01/2019 T. TYSOR    Culture (A)  Final    STAPHYLOCOCCUS AUREUS SUSCEPTIBILITIES TO FOLLOW Performed at [facility], [place] [address], [place], [place] [address]    Report Status PENDING  Incomplete  Blood Culture ID Panel (Reflexed)     Status: Abnormal   Collection Time: 10/30/19 10:29 PM  Result Value Ref Range Status   Enterococcus species NOT DETECTED NOT DETECTED Final   Listeria monocytogenes NOT DETECTED NOT DETECTED Final   Staphylococcus species DETECTED (A) NOT DETECTED Final    Comment: CRITICAL RESULT CALLED TO, READ BACK BY AND VERIFIED WITH: Dahlia Bailiff RN [phone] 11/01/2019 T. TYSOR    Staphylococcus aureus (BCID) DETECTED (A) NOT DETECTED Final    Comment: Methicillin (oxacillin)-resistant Staphylococcus aureus (MRSA). MRSA is predictably resistant to beta-lactam antibiotics (except ceftaroline). Preferred therapy is vancomycin unless clinically contraindicated. Patient requires contact precautions if  hospitalized. CRITICAL RESULT CALLED TO, READ BACK BY AND VERIFIED WITH: Dahlia Bailiff RN [phone] 11/01/2019 T. TYSOR    Methicillin resistance DETECTED (A) NOT DETECTED Final    Comment: CRITICAL RESULT CALLED TO, READ BACK BY AND VERIFIED WITH: Dahlia Bailiff RN [phone] 11/01/2019 T. TYSOR    Streptococcus species NOT DETECTED NOT DETECTED Final   Streptococcus agalactiae NOT DETECTED NOT DETECTED Final   Streptococcus pneumoniae NOT DETECTED NOT DETECTED Final   Streptococcus pyogenes NOT DETECTED NOT DETECTED Final   Acinetobacter baumannii NOT DETECTED NOT DETECTED Final   Enterobacteriaceae species NOT DETECTED NOT DETECTED Final   Enterobacter cloacae complex NOT DETECTED NOT DETECTED Final   Escherichia coli NOT DETECTED NOT DETECTED Final   Klebsiella oxytoca NOT DETECTED NOT DETECTED Final    Klebsiella pneumoniae NOT DETECTED NOT DETECTED Final   Proteus species NOT DETECTED NOT DETECTED Final   Serratia marcescens NOT DETECTED NOT DETECTED Final   Haemophilus influenzae NOT DETECTED NOT DETECTED Final   Neisseria meningitidis NOT DETECTED NOT DETECTED Final   Pseudomonas aeruginosa NOT DETECTED NOT DETECTED Final   Candida albicans NOT DETECTED NOT DETECTED Final   Candida glabrata NOT DETECTED NOT DETECTED Final   Candida krusei NOT DETECTED NOT DETECTED Final   Candida parapsilosis NOT DETECTED NOT DETECTED Final   Candida tropicalis NOT DETECTED NOT DETECTED Final    Comment: Performed at [facility], [place] [address], [place], [place] [address]  Culture, blood (Routine X 2) w Reflex to ID Panel     Status: Abnormal (Preliminary result)   Collection Time: 10/30/19 10:36 PM   Specimen: BLOOD RIGHT ARM  Result Value Ref Range Status   Specimen Description   Final    BLOOD RIGHT ARM Performed at [facility], [address] [place], [place] [address]    Special Requests   Final    BOTTLES DRAWN AEROBIC AND ANAEROBIC Blood Culture adequate volume Performed at [facility], [address] [place], [place] [address]    Culture  Setup Time   Final    IN BOTH AEROBIC AND ANAEROBIC BOTTLES GRAM POSITIVE COCCI IN CLUSTERS CRITICAL RESULT CALLED TO, READ BACK BY AND VERIFIED WITH: Jennye Boroughs RN 10/31/19 2153 JDW Performed at [facility], [place] [address] [place], [place] [address]    Culture STAPHYLOCOCCUS AUREUS (A)  Final   Report Status PENDING  Incomplete  Urine culture     Status: Abnormal (Preliminary result)   Collection Time: 11/01/19 10:09 AM   Specimen: In/Out Cath Urine  Result Value Ref Range Status   Specimen Description   Final    IN/OUT CATH URINE Performed at [facility], [address] [place], [place] [address]    Special Requests   Final    NONE Performed at [facility], [address]  [place], [place] [address]    Culture (A)  Final    900 COLONIES/mL STAPHYLOCOCCUS AUREUS SUSCEPTIBILITIES TO FOLLOW Performed at [facility], [place] [address] [place], [place] [address]    Report Status PENDING  Incomplete  SARS CORONAVIRUS 2 (TAT 6-24 HRS) Nasopharyngeal Nasopharyngeal Swab     Status: None   Collection Time: 11/01/19 10:55 AM   Specimen: Nasopharyngeal Swab  Result Value Ref Range Status   SARS Coronavirus 2 NEGATIVE NEGATIVE Final    Comment: (NOTE) SARS-CoV-2 target nucleic acids are NOT DETECTED. The SARS-CoV-2 RNA is generally detectable in upper and lower respiratory specimens during the acute phase of infection. Negative results do not preclude SARS-CoV-2 infection, do not rule out co-infections with other pathogens, and should not be used as the sole basis for treatment or other patient management decisions. Negative results must be combined with clinical observations, patient history, and epidemiological information. The expected result is Negative. Fact Sheet for Patients: SugarRoll.be Fact Sheet for Healthcare Providers: https://www.woods-mathews.com/ This test is not yet approved or cleared by the [place] FDA and  has been authorized for detection and/or diagnosis of SARS-CoV-2 by FDA under an Emergency Use Authorization (EUA). This EUA will remain  in effect (meaning this test can be used) for the duration of the COVID-19 declaration under Section 56 4(b)(1) of the Act, 21 U.S.C. section 360bbb-3(b)(1), unless the authorization is terminated or revoked sooner. Performed at [facility], [place] [address], [place], [place] [address]     Radiographs and labs were personally reviewed by me.   Bobby Rumpf, MD [facility] [phone] 11/02/2019, 10:29 AM

## 2019-11-02 NOTE — Progress Notes (Signed)
Hospitalist progress note   Patient from home, Patient going possibly home, Dispo unclear currently  John Houston [id number] DOB: [birth year] DOA: 11/01/2019  PCP: Crist Infante, MD   Narrative:  69 year old white male prior admission for chest pain 2009, left hallux pain in the past reflux syndrome Gilbert's syndrome impaired fasting glucose probable depression Initially came to emergency room 2/23 with flank pain-because of prior history of kidney stones sent to ED for CT scan-he was started on Keflex prior to coming to the ED on 2/23-was given Rocephin and discharged home with Cipro in addition to the Keflex Blood cultures apparently returned 2/2 bottles 2/23 Staph aureus which was MRSA resistant  White count was found to be 20 lactic acid 0.9 because of persisting left foot pain he had an x-ray which showed ill-defined soft tissue calcification  Data Reviewed:  White count 21-->15.8 Hemoglobin 14.5-->12.8 platelet 191 Potassium 3.5 BUN/creatinine 12/0.8 LFTs normal MRI performed 2/25 showed possible gout 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  Assessment & Plan:  MRSA bacteremia with urine infection Continue vancomycin-await ID input if blood does grow MRSA will probably need up to 14 days of treatment versus oritavancin or long-acting anti-MRSA antibiotic Gout Has never had this before does not taken high fructose corn syrup and this might be related to prior to admission Keflex versus ciprofloxacin I will increase his colchicine to 0.3 twice daily and stop his Toradol at this time can continue Norco short-term Prior chest pain None at this time Gilbert's syndrome History of-mild elevation of ALT to 61 no further work-up Impaired fasting glucose Reflux Infectious disease  Subjective:  Awake coherent relates some right flank pain prior to admission and difficulty passing urine No current chest pain Unable to really bear weight on his left toe but he  is better than he was he states  Consultants:   Infectious disease  Objective: Vitals:   11/01/19 1527 11/01/19 1609 11/01/19 2003 11/02/19 0619  BP: (!) 139/98 (!) 136/94 (!) 138/92 133/83  Pulse: 94 85 83 78  Resp: 18 18 16 16   Temp:  98.8 F (37.1 C) 98.2 F (36.8 C) 99.2 F (37.3 C)  TempSrc:  Oral    SpO2: 97% 100% 98% 97%    Intake/Output Summary (Last 24 hours) at 11/02/2019 0935 Last data filed at 11/02/2019 [id number] Gross per 24 hour  Intake 1003 ml  Output 1150 ml  Net -147 ml   There were no vitals filed for this visit.  Examination: Awake coherent pleasant younger than stated age EOMI NCAT S1-S2 no murmur Abdomen soft No flank tenderness Left great toe seems slightly swollen and erythematous ROM intact neurologically intact  Scheduled Meds: . colchicine  0.3 mg Oral Daily  . enoxaparin (LOVENOX) injection  40 mg Subcutaneous Q24H  . tamsulosin  0.4 mg Oral Daily   Continuous Infusions: . sodium chloride Stopped (11/01/19 1404)  . sodium chloride 100 mL/hr at 11/02/19 0300  . vancomycin       LOS: 1 day   Time spent: [place], MD [facility]  11/02/2019, 9:35 AM

## 2019-11-02 NOTE — Plan of Care (Signed)

## 2019-11-03 LAB — CBC WITH DIFFERENTIAL/PLATELET
Abs Immature Granulocytes: 1.55 10*3/uL — ABNORMAL HIGH (ref 0.00–0.07)
Basophils Absolute: 0 10*3/uL (ref 0.0–0.1)
Basophils Relative: 0 %
Eosinophils Absolute: 0.3 10*3/uL (ref 0.0–0.5)
Eosinophils Relative: 2 %
HCT: 38.2 % — ABNORMAL LOW (ref 39.0–52.0)
Hemoglobin: 12.7 g/dL — ABNORMAL LOW (ref 13.0–17.0)
Immature Granulocytes: 9 %
Lymphocytes Relative: 12 %
Lymphs Abs: 2 10*3/uL (ref 0.7–4.0)
MCH: 32.4 pg (ref 26.0–34.0)
MCHC: 33.2 g/dL (ref 30.0–36.0)
MCV: 97.4 fL (ref 80.0–100.0)
Monocytes Absolute: 1.3 10*3/uL — ABNORMAL HIGH (ref 0.1–1.0)
Monocytes Relative: 8 %
Neutro Abs: 11.4 10*3/uL — ABNORMAL HIGH (ref 1.7–7.7)
Neutrophils Relative %: 69 %
Platelets: 249 10*3/uL (ref 150–400)
RBC: 3.92 MIL/uL — ABNORMAL LOW (ref 4.22–5.81)
RDW: 12.3 % (ref 11.5–15.5)
WBC: 16.6 10*3/uL — ABNORMAL HIGH (ref 4.0–10.5)
nRBC: 0 % (ref 0.0–0.2)

## 2019-11-03 LAB — CULTURE, BLOOD (ROUTINE X 2)
Special Requests: ADEQUATE
Special Requests: ADEQUATE

## 2019-11-03 LAB — RENAL FUNCTION PANEL
Albumin: 3 g/dL — ABNORMAL LOW (ref 3.5–5.0)
Anion gap: 9 (ref 5–15)
BUN: 8 mg/dL (ref 8–23)
CO2: 26 mmol/L (ref 22–32)
Calcium: 8.7 mg/dL — ABNORMAL LOW (ref 8.9–10.3)
Chloride: 106 mmol/L (ref 98–111)
Creatinine, Ser: 0.74 mg/dL (ref 0.61–1.24)
GFR calc Af Amer: >60 mL/min (ref >60)
GFR calc non Af Amer: >60 mL/min (ref >60)
Glucose, Bld: 114 mg/dL — ABNORMAL HIGH (ref 70–99)
Phosphorus: 3.3 mg/dL (ref 2.5–4.6)
Potassium: 3.6 mmol/L (ref 3.5–5.1)
Sodium: 141 mmol/L (ref 135–145)

## 2019-11-03 LAB — URINE CULTURE: Culture: 900 — AB

## 2019-11-03 NOTE — Progress Notes (Signed)
Hospitalist progress note   Patient from home, Patient going possibly home, Dispo unclear currently  John Houston [id number] DOB: May 24, 1951 DOA: 11/01/2019  PCP: Crist Infante, MD   Narrative:  69 year old white male prior admission for chest pain 2009, left hallux pain in the past reflux syndrome Gilbert's syndrome impaired fasting glucose probable depression Initially came to emergency room 2/23 with flank pain-because of prior history of kidney stones sent to ED for CT scan-he was started on Keflex prior to coming to the ED on 2/23-was given Rocephin and discharged home with Cipro in addition to the Keflex Blood cultures apparently returned 2/2 bottles 2/23 Staph aureus which was MRSA resistant  White count was found to be 20 lactic acid 0.9 because of persisting left foot pain he had an x-ray which showed ill-defined soft tissue calcification  Data Reviewed:  White count 21-->15.8-->16.6 Hemoglobin 14.5-->12.7 platelet 249 Potassium 3.6 BUN/creatinine 12/0.8-->8/0.7 LFTs normal MRI performed 2/25 showed possible gout 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 Echo 2/26 no veg  Assessment & Plan:  MRSA bacteremia with urine infection Continue vancomycin-MRI L spine/Foot negative-next step Echo TEE if can be arranged and if he decides to wait for it to be done--telling me he wants to go home Gout Now on colchicine 0.3 twice daily - continue Norco short-term improved Prior chest pain None at this time Gilbert's syndrome History of-mild elevation of ALT to 61 no further work-up Impaired fasting glucose Reflux Infectious disease  Subjective:  Rearing to go home--doesn't wish to stay Pain in foot better still swollen  Consultants:   Infectious disease  Objective: Vitals:   11/01/19 2003 11/02/19 0619 11/02/19 2125 11/03/19 0621  BP: (!) 138/92 133/83 129/78 137/86  Pulse: 83 78 83 68  Resp: 16 16 18 20   Temp: 98.2 F (36.8 C) 99.2 F (37.3 C) (!)  101.1 F (38.4 C) 98.6 F (37 C)  TempSrc:   Oral Oral  SpO2: 98% 97% 99% 98%    Intake/Output Summary (Last 24 hours) at 11/03/2019 1153 Last data filed at 11/03/2019 0600 Gross per 24 hour  Intake 1260.32 ml  Output 2700 ml  Net -1439.68 ml   There were no vitals filed for this visit.  Examination:  EOMI NCAT S1-S2 no murmur Abdomen soft No flank tenderness Left great toe seems slightly swollen and erythematous ROM intact neurologically intact  Scheduled Meds: . colchicine  0.3 mg Oral BID  . enoxaparin (LOVENOX) injection  40 mg Subcutaneous Q24H  . tamsulosin  0.4 mg Oral Daily   Continuous Infusions: . sodium chloride Stopped (11/01/19 1404)  . sodium chloride 100 mL/hr at 11/02/19 0300  . vancomycin 1,000 mg (11/03/19 0022)     LOS: 2 days   Time spent: [place], MD [facility]  11/03/2019, 11:53 AM

## 2019-11-03 NOTE — Progress Notes (Signed)
PHARMACY - PHYSICIAN COMMUNICATION CRITICAL VALUE ALERT - BLOOD CULTURE IDENTIFICATION (BCID)  John Houston is an 69 y.o. male who presented to [facility] on 11/01/2019 with a chief complaint of left side flank pain and left foot pain.  He's currently on vancomycin for MRSA UTI and bacteremia.  Micro lab reported on 11/03/19 that 1/4 blood cxbotles from 2/25 ow has GPC in clusters. They will not run BCID since previous cultures were positive for MRSA.  Name of physician (or Provider) Contacted: Dr. Verlon Au  Current antibiotics: vancomycin  Changes to prescribed antibiotics recommended:  - Continue vancomycin  Results for orders placed or performed during the hospital encounter of 10/30/19  Blood Culture ID Panel (Reflexed) (Collected: 10/30/2019 10:29 PM)  Result Value Ref Range   Enterococcus species NOT DETECTED NOT DETECTED   Listeria monocytogenes NOT DETECTED NOT DETECTED   Staphylococcus species DETECTED (A) NOT DETECTED   Staphylococcus aureus (BCID) DETECTED (A) NOT DETECTED   Methicillin resistance DETECTED (A) NOT DETECTED   Streptococcus species NOT DETECTED NOT DETECTED   Streptococcus agalactiae NOT DETECTED NOT DETECTED   Streptococcus pneumoniae NOT DETECTED NOT DETECTED   Streptococcus pyogenes NOT DETECTED NOT DETECTED   Acinetobacter baumannii NOT DETECTED NOT DETECTED   Enterobacteriaceae species NOT DETECTED NOT DETECTED   Enterobacter cloacae complex NOT DETECTED NOT DETECTED   Escherichia coli NOT DETECTED NOT DETECTED   Klebsiella oxytoca NOT DETECTED NOT DETECTED   Klebsiella pneumoniae NOT DETECTED NOT DETECTED   Proteus species NOT DETECTED NOT DETECTED   Serratia marcescens NOT DETECTED NOT DETECTED   Haemophilus influenzae NOT DETECTED NOT DETECTED   Neisseria meningitidis NOT DETECTED NOT DETECTED   Pseudomonas aeruginosa NOT DETECTED NOT DETECTED   Candida albicans NOT DETECTED NOT DETECTED   Candida glabrata NOT DETECTED NOT DETECTED   Candida  krusei NOT DETECTED NOT DETECTED   Candida parapsilosis NOT DETECTED NOT DETECTED   Candida tropicalis NOT DETECTED NOT DETECTED    Prabhleen Montemayor P 11/03/2019  1:01 PM

## 2019-11-03 NOTE — Progress Notes (Signed)
   Patient Name: LASON HEICK Date of Encounter: 11/03/2019  Contacted by Nada Libman to schedule TEE PT with bacteremia Hx  Reviewed   No obvious contraindications for procedure   I spoke with pt   REviewed risks/benefits of procedure    He understands and agrees to proceed   Will try to schedule for Monday 3/1  Unfortunately endoscopy is closed today / tomorrow  Cannot confirm spot until 3/1 Keep NPO after MN on Sunday      Signed, Dorris Carnes, MD  11/03/2019, 12:25 PM

## 2019-11-04 LAB — CULTURE, BLOOD (ROUTINE X 2)

## 2019-11-04 MED ORDER — SODIUM CHLORIDE 0.9 % IV SOLN: INTRAVENOUS | Status: DC

## 2019-11-04 NOTE — Progress Notes (Signed)
Hospitalist progress note   Patient from home, Patient going possibly home, Dispo unclear currently  John Houston [id number] DOB: 11-23-1950 DOA: 11/01/2019  PCP: Crist Infante, MD   Narrative:  69 year old white male prior admission for chest pain 2009, left hallux pain in the past reflux syndrome Gilbert's syndrome impaired fasting glucose probable depression Initially came to emergency room 2/23 with flank pain-because of prior history of kidney stones sent to ED for CT scan-he was started on Keflex prior to coming to the ED on 2/23-was given Rocephin and discharged home with Cipro in addition to the Keflex Blood cultures apparently returned 2/2 bottles 2/23 Staph aureus which was MRSA resistant  White count was found to be 20 lactic acid 0.9 because of persisting left foot pain he had an x-ray which showed ill-defined soft tissue calcification  Data Reviewed:  White count 21-->15.8-->16.6 Hemoglobin 14.5-->12.7 platelet 249 Potassium 3.6 BUN/creatinine 12/0.8-->8/0.7 LFTs normal MRI performed 2/25 showed possible gout 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 Echo 2/26 no veg  Assessment & Plan:  MRSA bacteremia with urine infection Continue vancomycin-MRI L spine/Foot negative-next step Echo TEE when can be arranged per Cardiology Gout Now on colchicine 0.3 twice daily - continue Norco short-term improved with Rx and bearing weight Prior chest pain None at this time Gilbert's syndrome History of-mild elevation of ALT to 61 no further work-up Impaired fasting glucose Reflux Infectious disease  Subjective:  Awake coherent in nad no focal deficit Sitting in chair  Consultants:   Infectious disease  Objective: Vitals:   11/03/19 0621 11/03/19 1318 11/03/19 2216 11/04/19 0629  BP: 137/86 124/78 130/88 132/84  Pulse: 68 73 73 70  Resp: 20 18 18 18   Temp: 98.6 F (37 C) 98.2 F (36.8 C) 99.8 F (37.7 C) 98.7 F (37.1 C)  TempSrc: Oral Oral  Oral Oral  SpO2: 98% 98% 97% 99%    Intake/Output Summary (Last 24 hours) at 11/04/2019 1124 Last data filed at 11/03/2019 1759 Gross per 24 hour  Intake 375 ml  Output 725 ml  Net -350 ml   There were no vitals filed for this visit.  Examination:  EOMI NCAT S1-S2 no murmur Abdomen soft No flank tenderness cta b no added sound Left great toe improved ROM intact neurologically intact  Scheduled Meds: . colchicine  0.3 mg Oral BID  . enoxaparin (LOVENOX) injection  40 mg Subcutaneous Q24H  . tamsulosin  0.4 mg Oral Daily   Continuous Infusions: . sodium chloride 100 mL/hr at 11/02/19 0300  . sodium chloride    . [START ON 11/05/2019] sodium chloride    . vancomycin 1,000 mg (11/03/19 2358)     LOS: 3 days   Time spent: [place], MD [facility]  11/04/2019, 11:24 AM

## 2019-11-04 NOTE — Progress Notes (Signed)
This shift pt states that he is not being informed of status and why he has to remain in-patient. After reviewing notes writer explained procedure TEE, that hopefully can be done on 3/1. Pt upset that he has to remain in the hospital, would like to go home and come back to do test as outpatient.

## 2019-11-05 ENCOUNTER — Encounter (HOSPITAL_COMMUNITY)
Admission: EM | Disposition: A | Discharge status: Home / Self Care | Payer: Self-pay | Source: Home / Self Care | Attending: Family Medicine

## 2019-11-05 ENCOUNTER — Inpatient Hospital Stay (HOSPITAL_COMMUNITY): Payer: PPO | Admitting: Certified Registered Nurse Anesthetist

## 2019-11-05 ENCOUNTER — Encounter (HOSPITAL_COMMUNITY): Payer: Self-pay | Admitting: Internal Medicine

## 2019-11-05 ENCOUNTER — Inpatient Hospital Stay (HOSPITAL_COMMUNITY): Payer: PPO

## 2019-11-05 ENCOUNTER — Inpatient Hospital Stay: Payer: Self-pay

## 2019-11-05 DIAGNOSIS — R7881 Bacteremia: Secondary | ICD-10-CM

## 2019-11-05 HISTORY — PX: TEE WITHOUT CARDIOVERSION: SHX5443

## 2019-11-05 LAB — BASIC METABOLIC PANEL
Anion gap: 10 (ref 5–15)
BUN: 12 mg/dL (ref 8–23)
CO2: 26 mmol/L (ref 22–32)
Calcium: 8.7 mg/dL — ABNORMAL LOW (ref 8.9–10.3)
Chloride: 104 mmol/L (ref 98–111)
Creatinine, Ser: 0.99 mg/dL (ref 0.61–1.24)
GFR calc Af Amer: >60 mL/min (ref >60)
GFR calc non Af Amer: >60 mL/min (ref >60)
Glucose, Bld: 104 mg/dL — ABNORMAL HIGH (ref 70–99)
Potassium: 3.6 mmol/L (ref 3.5–5.1)
Sodium: 140 mmol/L (ref 135–145)

## 2019-11-05 LAB — PATHOLOGIST SMEAR REVIEW

## 2019-11-05 LAB — PROTIME-INR
INR: 1.1 (ref 0.8–1.2)
Prothrombin Time: 13.6 seconds (ref 11.4–15.2)

## 2019-11-05 SURGERY — ECHOCARDIOGRAM, TRANSESOPHAGEAL
Anesthesia: Monitor Anesthesia Care

## 2019-11-05 MED ORDER — COLCHICINE 0.6 MG PO TABS: 0.3000 mg | ORAL_TABLET | Freq: Two times a day (BID) | ORAL | 1 refills | Status: DC

## 2019-11-05 MED ORDER — ACETAMINOPHEN 325 MG PO TABS: 650.0000 mg | ORAL_TABLET | Freq: Four times a day (QID) | ORAL | Status: DC | PRN

## 2019-11-05 MED ORDER — TAMSULOSIN HCL 0.4 MG PO CAPS: 0.4000 mg | ORAL_CAPSULE | Freq: Every day | ORAL | 1 refills | Status: DC

## 2019-11-05 MED ORDER — SENNOSIDES-DOCUSATE SODIUM 8.6-50 MG PO TABS: 1.0000 | ORAL_TABLET | Freq: Every evening | ORAL | 0 refills | Status: DC | PRN

## 2019-11-05 MED ORDER — PROPOFOL 500 MG/50ML IV EMUL
INTRAVENOUS | Status: DC | PRN
  Administered 2019-11-05: 100 ug/kg/min via INTRAVENOUS

## 2019-11-05 MED ORDER — VANCOMYCIN IV (FOR PTA / DISCHARGE USE ONLY): 1000.0000 mg | Freq: Two times a day (BID) | INTRAVENOUS | 0 refills | Status: AC

## 2019-11-05 MED ORDER — LACTATED RINGERS IV SOLN
INTRAVENOUS | Status: AC | PRN
  Administered 2019-11-05: 1000 mL via INTRAVENOUS

## 2019-11-05 MED ORDER — PROPOFOL 10 MG/ML IV BOLUS
INTRAVENOUS | Status: DC | PRN
  Administered 2019-11-05 (×2): 10 mg via INTRAVENOUS

## 2019-11-05 MED ORDER — LACTATED RINGERS IV SOLN: INTRAVENOUS | Status: DC | PRN

## 2019-11-05 NOTE — Discharge Summary (Signed)
Physician Discharge Summary  John Houston ULA:[id number] DOB: 07/12/1951 DOA: 11/01/2019  PCP: Crist Infante, MD  Admit date: 11/01/2019 Discharge date: 11/05/2019  Time spent: 30 minutes  Recommendations for Outpatient Follow-up:  1. Needs vancomycin until March 12 and needs vancomycin trough drawn 11/06/2019 for goal of 15-20-labs reported to Dr. Linus Salmons of [facility] 2. Pulled PICC line after completion of therapy 11/16/2019 3. Empiric management of gout if symptoms resolve may be able to discontinue colchicine in about 2 weeks to 2 months  Discharge Diagnoses:  Active Problems:   MRSA bacteremia   Bacteremia   Discharge Condition: Improved  Diet recommendation: Heart healthy  Filed Weights   11/05/19 1151 11/05/19 1206  Weight: 77 kg 77 kg    History of present illness:  69 year old white male prior admission for chest pain 2009, left hallux pain 2/2 fracture in the past, reflux syndrome, Gilbert's syndrome impaired fasting glucose probable depression Initially came to emergency room 2/23 with flank pain-because of prior history of kidney stones sent to ED for CT scan-he was started on Keflex prior to coming to the ED on 2/23-was given Rocephin and discharged home with Cipro in addition to the Keflex on discharge from the emergency room Blood cultures apparently returned 2/2 bottles 2/23 Staph aureus which was MRSA resistant  White count was found to be 20 lactic acid 0.9 because of persisting left foot pain he had an x-ray which showed ill-defined soft tissue calcification   Hospital Course:  MRSA bacteremia with urine infection Continue vancomycin-MRI L spine/Foot for source of infection therefore ID was consulted recommended TEE which was performed 11/05/2019 without any endocarditis ID recommended on follow-up completion of vancomycin for 2 weeks given no endocarditis completing on 3/12 and patient will need to have repeat vancomycin trough on 3/2 Gout Now on colchicine 0.3 twice  daily - continue Norco short-term improved with Rx and bearing weight Prior chest pain None at this time Gilbert's syndrome History of-mild elevation of ALT to 61 no further work-up Impaired fasting glucose Reflux Infectious disease   Procedures: TEE 11/05/2019 Left Ventricle: Normal left ventricular ejection fraction 55%  Mitral Valve: There is a small, extremely thin filamentous echodensity along the atrial surface of the mitral valve leaflet.  This does not have the typical appearance of a valvular vegetation.  Aortic Valve: Normal valve  Tricuspid Valve: Normal, trivial tricuspid regurgitation  Left Atrium: No left atrial appendage thrombus  Impression: Negative endocarditis PICC line placement 11/05/2019   Discharge Exam: Vitals:   11/05/19 1400 11/05/19 1428  BP: 116/76 135/89  Pulse:  67  Resp: 13   Temp:  98.1 F (36.7 C)  SpO2: 100% 100%    General: Awake alert coherent feels well moving around without deficit Cardiovascular: S1-S2 no murmur rub or gallop RRR sinus on monitors Respiratory: Clinically clear no added sound no rales or rhonchi abdomen soft no rebound Left great toe not swollen no red  Discharge Instructions   Discharge Instructions    Diet - low sodium heart healthy   Complete by: As directed    Discharge instructions   Complete by: As directed    You will need to complete all of the IV antibiotics on March 12 and subsequently the special IV that has been placed will be pulled out Periodically the nurse will come and draw labs and report them to your doctors who have been helping treat your infection We have given you a limited prescription of colchicine for gout he will need  it for at least 2 months and if you do well you do not need it further Please follow-up with primary care physician and get labs as per their indications   Home infusion instructions   Complete by: As directed    Instructions: Flushing of vascular access device:  0.9% NaCl pre/post medication administration and prn patency; Heparin 100 u/ml, 20m for implanted ports and Heparin 10u/ml, 564mfor all other central venous catheters.   Increase activity slowly   Complete by: As directed      Allergies as of 11/05/2019   No Known Allergies     Medication List    STOP taking these medications   cephALEXin 500 MG capsule Commonly known as: KEFLEX   ciprofloxacin 500 MG tablet Commonly known as: CIPRO   fluconazole 100 MG tablet Commonly known as: DIFLUCAN   nystatin ointment Commonly known as: MYCOSTATIN   phenazopyridine 100 MG tablet Commonly known as: PYRIDIUM     TAKE these medications   acetaminophen 325 MG tablet Commonly known as: TYLENOL Take 2 tablets (650 mg total) by mouth every 6 (six) hours as needed for mild pain (or Fever >/= 101).   colchicine 0.6 MG tablet Take 0.5 tablets (0.3 mg total) by mouth 2 (two) times daily.   HYDROcodone-acetaminophen 5-325 MG tablet Commonly known as: NORCO/VICODIN Take 1-2 tablets by mouth every 6 (six) hours as needed for moderate pain.   multivitamin with minerals tablet Take 1 tablet by mouth daily.   senna-docusate 8.6-50 MG tablet Commonly known as: Senokot-S Take 1 tablet by mouth at bedtime as needed for mild constipation.   tamsulosin 0.4 MG Caps capsule Commonly known as: FLOMAX Take 1 capsule (0.4 mg total) by mouth daily. Start taking on: November 06, 2019   vancomycin  IVPB Inject 1,000 mg into the vein every 12 (twelve) hours for 23 doses. Indication:  MRSA bacteremia Last Day of Therapy:  11/16/19 Labs - Sunday/Monday:  CBC/D, BMP, and vancomycin trough. Note that vanc trough will need to be drawn prior to 1st 3/2 dose Labs - Thursday:  BMP and vancomycin trough Labs - Every other week:  ESR and CRP            Home Infusion Instuctions  (From admission, onward)         Start     Ordered   11/05/19 0000  Home infusion instructions    Question:  Instructions   Answer:  Flushing of vascular access device: 0.9% NaCl pre/post medication administration and prn patency; Heparin 100 u/ml, 54m59mor implanted ports and Heparin 10u/ml, 54ml42mr all other central venous catheters.   11/05/19 1542         No Known Allergies    The results of significant diagnostics from this hospitalization (including imaging, microbiology, ancillary and laboratory) are listed below for reference.    Significant Diagnostic Studies: DG Chest 2 View  Result Date: 10/30/2019 CLINICAL DATA:  Upper abdominal pain EXAM: CHEST - 2 VIEW COMPARISON:  12/11/2007 FINDINGS: Heart size and vascularity normal. Negative for infiltrate or effusion. Minimal atelectasis in the bases. IMPRESSION: No active cardiopulmonary disease. Electronically Signed   By: CharFranchot Gallo.   On: 10/30/2019 20:53   MR Lumbar Spine W Wo Contrast  Result Date: 11/02/2019 CLINICAL DATA:  Provided indication: Mid back pain, history of MRSA EXAM: MRI LUMBAR SPINE WITHOUT AND WITH CONTRAST TECHNIQUE: Multiplanar and multiecho pulse sequences of the lumbar spine were obtained without and with intravenous contrast. CONTRAST:  62m GADAVIST GADOBUTROL 1 MMOL/ML IV SOLN COMPARISON:  CT abdomen/pelvis 10/30/2019 FINDINGS: Segmentation: For the purposes of this dictation, five lumbar vertebrae are assumed and the caudal most well-formed intervertebral disc is designated L5-S1. Alignment: Straightening of the expected cervical lordosis. Trace L2-L3 and L3-L4 retrolisthesis. Trace L4-L5 anterolisthesis. Three mm L5-S1 retrolisthesis Vertebrae: Vertebral body height is maintained. There is multilevel degenerative endplate marrow signal. Most notably there is moderate fatty degenerative endplate marrow signal at L5-S1. Minimal degenerative endplate edema at LI4-P8and L5-S1 is likely degenerative. No suspicious osseous enhancement. Conus medullaris and cauda equina: Conus extends to the L1-L2 level. Thin T1 hyperintensity within  the midline dorsal thecal sac at the L3-L4 level may reflect a small fibrolipoma of the filum terminale. No abnormal enhancement of the visualized distal spinal cord or cauda equina nerve roots. Paraspinal and other soft tissues: Edema and enhancement within the interspinous spaces at L4-L5. Atrophy of the lumbar paraspinal musculature. No abnormality identified within included portions of the abdomen/retroperitoneum. Disc levels: Moderate/advanced L5-S1 disc degeneration. Mild to moderate disc degeneration at the remaining levels. L1-L2: Disc bulge. Superimposed small central and left subarticular disc protrusions. Mild facet arthrosis. Mild relative central canal and left subarticular narrowing without frank nerve root impingement. No significant foraminal stenosis. L2-L3: Disc bulge. Superimposed left center disc protrusion. Moderate facet arthrosis with ligamentum flavum hypertrophy. Moderate/advanced spinal canal stenosis and bilateral subarticular narrowing. Crowding of bilateral descending nerve roots, particularly the bilateral descending L3 nerve roots. No significant foraminal stenosis. L3-L4: Disc bulge. Mild facet arthrosis/ligamentum flavum hypertrophy. Mild bilateral subarticular narrowing (greater on the right) without frank nerve root impingement. Mild relative narrowing of the central canal. No significant foraminal stenosis. L4-L5: Disc bulge. Moderate facet arthrosis with ligamentum flavum hypertrophy. Moderate/advanced central canal and bilateral subarticular narrowing with crowding of the bilateral descending L5 nerve roots. Mild relative neural foraminal narrowing on the left. L5-S1: Irregular disc bulge with endplate spurring. Superimposed broad-based central disc protrusion. Moderate facet arthrosis with ligamentum flavum hypertrophy. Bilateral subarticular narrowing with medialization of the bilateral descending S1 nerve roots. Mild relative narrowing of the central canal. Moderate bilateral  neural foraminal narrowing (greater on the right). IMPRESSION: No evidence of discitis/osteomyelitis within the lumbar spine. Mild endplate edema at LK9-X8and L5-S1 is likely degenerative. Lumbar spondylosis as outlined and most notably as follows. At L2-L3, multifactorial moderate/advanced central canal and bilateral subarticular stenosis. Crowding of bilateral descending nerve roots, particularly the bilateral descending L3 nerve roots. At L4-L5 multifactorial moderate/advanced central canal and bilateral subarticular stenosis. Crowding of the bilateral descending L5 nerve roots. Mild left neural foraminal narrowing. L5-S1, multifactorial moderate bilateral neural foraminal narrowing. Mild spinal canal stenosis also present at this level. Edema and enhancement within the L4-L5 interspinous space, a finding which may be seen in the setting of a Baastrup's disease. Electronically Signed   By: KKellie SimmeringDO   On: 11/02/2019 14:16   [place]NAL  Result Date: 11/02/2019 CLINICAL DATA:  Left flank pain for greater than 2 years EXAM: RENAL / URINARY TRACT ULTRASOUND COMPLETE COMPARISON:  CT renal colic [id number] CT abdomen pelvis April 18, 2017 FINDINGS: Right Kidney: Renal measurements: 11.1 x 6.6 x 5.6 cm = volume: 214.2 mL. Normal cortical echogenicity. Mild renal sinus lipomatosis. No hydronephrosis, shadowing calculi or concerning renal mass. Left Kidney: Renal measurements: 10.5 x 5.2 x 5.0 cm = volume: 143.4 mL. Normal cortical echogenicity. Mild renal sinus lipomatosis. No hydronephrosis, shadowing calculi or concerning renal mass. Bladder: Bladder is largely decompressed at the  time of imaging. No gross bladder abnormality is seen for the degree of distention. Other: None IMPRESSION: Mild bilateral renal sinus lipomatosis, nonspecific finding which can be seen with advanced age or diminished renal function. Otherwise unremarkable renal ultrasound. Electronically Signed   By: Lovena Le M.D.   On:  11/02/2019 01:16   MR FOOT LEFT W WO CONTRAST  Result Date: 11/02/2019 CLINICAL DATA:  Foot pain. EXAM: MRI OF THE LEFT FOREFOOT WITHOUT AND WITH CONTRAST TECHNIQUE: Multiplanar, multisequence MR imaging of the left foot was performed both before and after administration of intravenous contrast. CONTRAST:  19m GADAVIST GADOBUTROL 1 MMOL/ML IV SOLN COMPARISON:  Radiographs 11/01/2019 FINDINGS: There are scattered erosions involving the metatarsal heads and evidence of periarticular soft tissue masses involving the first and fourth MTP joints also demonstrating calcifications on the plain films. There moderate-sized joint effusions are also noted most notably at the first MTP joint. Findings consistent with tophaceous gout. There is also surrounding soft tissue inflammation and mild myositis. No acute fracture or worrisome bone lesions. The visualized midfoot bony structures are intact and the midfoot joint spaces are maintained. The major tendons and ligaments are intact. IMPRESSION: 1. Findings consistent with tophaceous gout, most notably involving involving the first and fourth MTP joints. Associated moderate-sized joint effusions and surrounding soft tissue inflammation and mild myositis. 2. No acute bony findings. Electronically Signed   By: PMarijo SanesM.D.   On: 11/02/2019 08:10   DG Chest Port 1 View  Result Date: 11/01/2019 CLINICAL DATA:  Left flank pain earlier this week, now with positive bacteremia EXAM: PORTABLE CHEST 1 VIEW COMPARISON:  10/30/2019 chest radiograph. FINDINGS: Stable cardiomediastinal silhouette with normal heart size. No pneumothorax. No pleural effusion. No pulmonary edema. No acute consolidative airspace disease. Minimal scarring versus atelectasis at the right lung base. IMPRESSION: Minimal right basilar scarring versus atelectasis. Otherwise no active disease in the chest. Electronically Signed   By: JIlona SorrelM.D.   On: 11/01/2019 10:29   DG Foot Complete  Left  Result Date: 11/01/2019 CLINICAL DATA:  Left foot pain. EXAM: LEFT FOOT - COMPLETE 3+ VIEW COMPARISON:  None. FINDINGS: There is no evidence of fracture or dislocation. Ill-defined soft tissue calcifications are seen medial to the first metatarsophalangeal joint with focal soft tissue swelling, concerning for possible gout. No other joint abnormality is noted. IMPRESSION: Ill-defined soft tissue calcifications are seen medial to first metatarsophalangeal joint with focal soft tissue swelling, concerning for possible gout. Electronically Signed   By: JMarijo ConceptionM.D.   On: 11/01/2019 10:41   ECHOCARDIOGRAM COMPLETE  Result Date: 11/02/2019    ECHOCARDIOGRAM REPORT   Patient Name:   GMARCELLAS MARCHANTDate of Exam: 11/02/2019 Medical Rec #:  [id number]       Height:       69.5 in Accession #:    [id number]      Weight:       170.0 lb Date of Birth:  8Jun 17, 1952        BSA:          1.938 m Patient Age:    89years         BP:           133/83 mmHg Patient Gender: M                HR:           103 bpm. Exam Location:  Inpatient Procedure: 2D Echo, Cardiac Doppler  and Color Doppler Indications:    Bacteremia.  History:        Patient has no prior history of Echocardiogram examinations.                 Signs/Symptoms:Bacteremia.  Sonographer:    Roseanna Rainbow RDCS Referring Phys: [id number] BELKYS A REGALADO IMPRESSIONS  1. Left ventricular ejection fraction, by estimation, is 60 to 65%. The left ventricle has normal function. The left ventricle has no regional wall motion abnormalities. Left ventricular diastolic parameters were normal.  2. Right ventricular systolic function is normal. The right ventricular size is normal. There is normal pulmonary artery systolic pressure.  3. Left atrial size was mildly dilated.  4. The mitral valve is normal in structure and function. Trivial mitral valve regurgitation. No evidence of mitral stenosis.  5. The aortic valve is normal in structure and function. Aortic valve  regurgitation is not visualized. No aortic stenosis is present.  6. The inferior vena cava is normal in size with greater than 50% respiratory variability, suggesting right atrial pressure of 3 mmHg. FINDINGS  Left Ventricle: Left ventricular ejection fraction, by estimation, is 60 to 65%. The left ventricle has normal function. The left ventricle has no regional wall motion abnormalities. The left ventricular internal cavity size was normal in size. There is  no left ventricular hypertrophy. Left ventricular diastolic parameters were normal. Right Ventricle: The right ventricular size is normal. No increase in right ventricular wall thickness. Right ventricular systolic function is normal. There is normal pulmonary artery systolic pressure. The tricuspid regurgitant velocity is 2.23 m/s, and  with an assumed right atrial pressure of 3 mmHg, the estimated right ventricular systolic pressure is 74.0 mmHg. Left Atrium: Left atrial size was mildly dilated. Right Atrium: Right atrial size was normal in size. Pericardium: There is no evidence of pericardial effusion. Mitral Valve: The mitral valve is normal in structure and function. There is mild thickening of the mitral valve leaflet(s). There is mild calcification of the mitral valve leaflet(s). Normal mobility of the mitral valve leaflets. Trivial mitral valve regurgitation. No evidence of mitral valve stenosis. Tricuspid Valve: The tricuspid valve is normal in structure. Tricuspid valve regurgitation is trivial. No evidence of tricuspid stenosis. Aortic Valve: The aortic valve is normal in structure and function. Aortic valve regurgitation is not visualized. No aortic stenosis is present. Pulmonic Valve: The pulmonic valve was normal in structure. Pulmonic valve regurgitation is not visualized. No evidence of pulmonic stenosis. Aorta: The aortic root is normal in size and structure. Venous: The inferior vena cava is normal in size with greater than 50% respiratory  variability, suggesting right atrial pressure of 3 mmHg. IAS/Shunts: No atrial level shunt detected by color flow Doppler.  LEFT VENTRICLE PLAX 2D LVIDd:         4.60 cm     Diastology LVIDs:         3.10 cm     LV e' lateral:   9.14 cm/s LV PW:         1.10 cm     LV E/e' lateral: 10.3 LV IVS:        1.10 cm     LV e' medial:    8.38 cm/s LVOT diam:     1.70 cm     LV E/e' medial:  11.3 LV SV:         48 LV SV Index:   25 LVOT Area:     2.27 cm  LV Volumes (MOD) LV  vol d, MOD A2C: 91.1 ml LV vol d, MOD A4C: 86.2 ml LV vol s, MOD A2C: 29.3 ml LV vol s, MOD A4C: 34.2 ml LV SV MOD A2C:     61.8 ml LV SV MOD A4C:     86.2 ml LV SV MOD BP:      57.0 ml RIGHT VENTRICLE             IVC RV S prime:     15.70 cm/s  IVC diam: 2.10 cm TAPSE (M-mode): 2.8 cm LEFT ATRIUM             Index       RIGHT ATRIUM           Index LA diam:        4.00 cm 2.06 cm/m  RA Area:     15.00 cm LA Vol (A2C):   31.5 ml 16.26 ml/m RA Volume:   32.80 ml  16.93 ml/m LA Vol (A4C):   38.0 ml 19.61 ml/m LA Biplane Vol: 37.4 ml 19.30 ml/m  AORTIC VALVE LVOT Vmax:   111.00 cm/s LVOT Vmean:  68.000 cm/s LVOT VTI:    0.210 m  AORTA Ao Root diam: 3.30 cm Ao Asc diam:  3.30 cm MITRAL VALVE               TRICUSPID VALVE MV Area (PHT): 3.17 cm    TR Peak grad:   19.9 mmHg MV Decel Time: 239 msec    TR Vmax:        223.00 cm/s MV E velocity: 94.30 cm/s MV A velocity: 85.70 cm/s  SHUNTS MV E/A ratio:  1.10        Systemic VTI:  0.21 m                            Systemic Diam: 1.70 cm Jenkins Rouge MD Electronically signed by Jenkins Rouge MD Signature Date/Time: 11/02/2019/3:04:04 PM    Final    CT Renal Stone Study  Result Date: 10/30/2019 CLINICAL DATA:  69 year old male with left flank pain. EXAM: CT ABDOMEN AND PELVIS WITHOUT CONTRAST TECHNIQUE: Multidetector CT imaging of the abdomen and pelvis was performed following the standard protocol without IV contrast. COMPARISON:  CT abdomen pelvis dated 04/18/2017. FINDINGS: Evaluation of this exam is  limited in the absence of intravenous contrast. Lower chest: Minimal bibasilar densities, likely atelectasis. No intra-abdominal free air or free fluid. Hepatobiliary: Diffuse fatty infiltration of the liver. Subcentimeter left hepatic hypodense focus is too small to characterize. No intrahepatic biliary ductal dilatation. Probable layering sludge within the gallbladder. No calcified gallstone or pericholecystic fluid. Pancreas: Unremarkable. No pancreatic ductal dilatation or surrounding inflammatory changes. Spleen: Normal in size without focal abnormality. Adrenals/Urinary Tract: The adrenal glands are unremarkable. There is no hydronephrosis or nephrolithiasis on either side. Bilateral perinephric stranding, nonspecific. Correlation with urinalysis recommended to exclude UTI. The visualized ureters appear unremarkable. The urinary bladder is partially distended. There is mild perivesical stranding. Correlation with urinalysis recommended. Stomach/Bowel: There is no bowel obstruction or active inflammation. The appendix is normal. Vascular/Lymphatic: Mild aortoiliac atherosclerotic disease. The IVC is unremarkable. No portal venous gas. There is no adenopathy. Reproductive: Enlarged prostate gland measuring 6.3 cm in transverse axial diameter. There is faint area of hypodensity in the left aspect of the prostate. Focal prostatitis is not excluded. Clinical correlation is recommended. Other: Small fat containing umbilical hernia. Musculoskeletal: Degenerative changes of the spine. No acute osseous  pathology. IMPRESSION: 1. No hydronephrosis or nephrolithiasis. Correlation with urinalysis recommended to evaluate for UTI. 2. No bowel obstruction or active inflammation.  Normal appendix. 3. Fatty liver. 4.  Aortic Atherosclerosis (ICD10-I70.0). Electronically Signed   By: Anner Crete M.D.   On: 10/30/2019 20:06   ECHO TEE  Result Date: 11/05/2019    TRANSESOPHOGEAL ECHO REPORT   Patient Name:   KALEL HARTY Date of Exam: 11/05/2019 Medical Rec #:  [id number]        Height:       69.5 in Accession #:    [id number]       Weight:       170.0 lb Date of Birth:  11/13/1950         BSA:          1.938 m Patient Age:    85 years         BP:           111/78 mmHg Patient Gender: M                HR:           74 bpm. Exam Location:  Inpatient Procedure: Transesophageal Echo, Cardiac Doppler, Color Doppler and 3D Echo Indications:     Bacteremia  History:         Patient has prior history of Echocardiogram examinations, most                  recent 11/02/2019. Signs/Symptoms:Bacteremia. No prior cardiac                  history.  Sonographer:     Roseanna Rainbow RDCS Referring Phys:  [id number] PAULA V ROSS Diagnosing Phys: Candee Furbish MD PROCEDURE: After discussion of the risks and benefits of a TEE, an informed consent was obtained from the patient. The transesophogeal probe was passed without difficulty through the esophogus of the patient. Imaged were obtained with the patient in a left lateral decubitus position. Sedation performed by different physician. The patient was monitored while under deep sedation. Anesthestetic sedation was provided intravenously by Anesthesiology: 221m of Propofol. The patient's vital signs; including heart rate, blood pressure, and oxygen saturation; remained stable throughout the procedure. The patient developed no complications during the procedure. IMPRESSIONS  1. Very thin fibrous strand on the atrial surface of mitral valve leaflet noted. Does not have the typical appearance of vegetation.  2. Left ventricular ejection fraction, by estimation, is 60 to 65%. The left ventricle has normal function. The left ventricle has no regional wall motion abnormalities.  3. Right ventricular systolic function is normal. The right ventricular size is normal.  4. No left atrial/left atrial appendage thrombus was detected.  5. Very thin fibrous strand on the atrial surface of mitral valve leaflet noted. Does not  have the typical appearance of vegetation. The mitral valve is normal in structure and function. No evidence of mitral valve regurgitation. No evidence of mitral stenosis.  6. The aortic valve is normal in structure and function. Aortic valve regurgitation is not visualized. No aortic stenosis is present.  7. The inferior vena cava is normal in size with greater than 50% respiratory variability, suggesting right atrial pressure of 3 mmHg. Conclusion(s)/Recommendation(s): No evidence of vegetation/infective endocarditis on this transesophageal echocardiogram. FINDINGS  Left Ventricle: Left ventricular ejection fraction, by estimation, is 60 to 65%. The left ventricle has normal function. The left ventricle has no regional wall motion abnormalities. The left ventricular internal  cavity size was normal in size. There is  no left ventricular hypertrophy. Right Ventricle: The right ventricular size is normal. No increase in right ventricular wall thickness. Right ventricular systolic function is normal. Left Atrium: Left atrial size was normal in size. No left atrial/left atrial appendage thrombus was detected. Right Atrium: Right atrial size was normal in size. Pericardium: There is no evidence of pericardial effusion. Mitral Valve: Very thin fibrous strand on the atrial surface of mitral valve leaflet noted. Does not have the typical appearance of vegetation. The mitral valve is normal in structure and function. Normal mobility of the mitral valve leaflets. No evidence of mitral valve regurgitation. No evidence of mitral valve stenosis. Tricuspid Valve: The tricuspid valve is normal in structure. Tricuspid valve regurgitation is not demonstrated. No evidence of tricuspid stenosis. Aortic Valve: The aortic valve is normal in structure and function. Aortic valve regurgitation is not visualized. No aortic stenosis is present. Pulmonic Valve: The pulmonic valve was normal in structure. Pulmonic valve regurgitation is not  visualized. No evidence of pulmonic stenosis. Aorta: The aortic root is normal in size and structure. Venous: The inferior vena cava is normal in size with greater than 50% respiratory variability, suggesting right atrial pressure of 3 mmHg. IAS/Shunts: No atrial level shunt detected by color flow Doppler. Additional Comments: Very thin fibrous strand on the atrial surface of mitral valve leaflet noted. Does not have the typical appearance of vegetation. Candee Furbish MD Electronically signed by Candee Furbish MD Signature Date/Time: 11/05/2019/1:35:58 PM    Final    [place] EKG SITE RITE  Result Date: 11/05/2019 If Site Rite image not attached, placement could not be confirmed due to current cardiac rhythm.   Microbiology: Recent Results (from the past 240 hour(s))  Urine Culture     Status: Abnormal   Collection Time: 10/30/19  8:23 PM   Specimen: Urine, Random  Result Value Ref Range Status   Specimen Description   Final    URINE, RANDOM Performed at [facility], [address] [place], [place] [address]    Special Requests   Final    NONE Performed at [facility], [address] [place], [place] [address]    Culture (A)  Final    <10,000 COLONIES/mL INSIGNIFICANT GROWTH Performed at [facility], [place] [address], [place], [place] [address]    Report Status 11/01/2019 FINAL  Final  Culture, blood (Routine X 2) w Reflex to ID Panel     Status: Abnormal   Collection Time: 10/30/19 10:29 PM   Specimen: BLOOD LEFT ARM  Result Value Ref Range Status   Specimen Description   Final    BLOOD LEFT ARM Performed at [facility], [address] [place], [place] [address]    Special Requests   Final    BOTTLES DRAWN AEROBIC AND ANAEROBIC Blood Culture adequate volume Performed at [facility], [address] [place], [place] [address]    Culture  Setup Time   Final    GRAM POSITIVE COCCI IN CLUSTERS IN BOTH AEROBIC AND ANAEROBIC  BOTTLES CRITICAL RESULT CALLED TO, READ BACK BY AND VERIFIED WITH: Dahlia Bailiff RN [phone] 11/01/2019 Mena Goes Performed at [facility], [place] [address] [place], [place] [address]    Culture METHICILLIN RESISTANT STAPHYLOCOCCUS AUREUS (A)  Final   Report Status 11/03/2019 FINAL  Final   Organism ID, Bacteria METHICILLIN RESISTANT STAPHYLOCOCCUS AUREUS  Final  Susceptibility   Methicillin resistant staphylococcus aureus - MIC*    CIPROFLOXACIN <=0.5 SENSITIVE Sensitive     ERYTHROMYCIN >=8 RESISTANT Resistant     GENTAMICIN <=0.5 SENSITIVE Sensitive     OXACILLIN >=4 RESISTANT Resistant     TETRACYCLINE <=1 SENSITIVE Sensitive     VANCOMYCIN 1 SENSITIVE Sensitive     TRIMETH/SULFA <=10 SENSITIVE Sensitive     CLINDAMYCIN <=0.25 SENSITIVE Sensitive     RIFAMPIN <=0.5 SENSITIVE Sensitive     Inducible Clindamycin NEGATIVE Sensitive     * METHICILLIN RESISTANT STAPHYLOCOCCUS AUREUS  Blood Culture ID Panel (Reflexed)     Status: Abnormal   Collection Time: 10/30/19 10:29 PM  Result Value Ref Range Status   Enterococcus species NOT DETECTED NOT DETECTED Final   Listeria monocytogenes NOT DETECTED NOT DETECTED Final   Staphylococcus species DETECTED (A) NOT DETECTED Final    Comment: CRITICAL RESULT CALLED TO, READ BACK BY AND VERIFIED WITH: Dahlia Bailiff RN [phone] 11/01/2019 T. TYSOR    Staphylococcus aureus (BCID) DETECTED (A) NOT DETECTED Final    Comment: Methicillin (oxacillin)-resistant Staphylococcus aureus (MRSA). MRSA is predictably resistant to beta-lactam antibiotics (except ceftaroline). Preferred therapy is vancomycin unless clinically contraindicated. Patient requires contact precautions if  hospitalized. CRITICAL RESULT CALLED TO, READ BACK BY AND VERIFIED WITH: Dahlia Bailiff RN [phone] 11/01/2019 T. TYSOR    Methicillin resistance DETECTED (A) NOT DETECTED Final    Comment: CRITICAL RESULT CALLED TO, READ BACK BY AND VERIFIED WITH: Dahlia Bailiff RN [phone]  11/01/2019 T. TYSOR    Streptococcus species NOT DETECTED NOT DETECTED Final   Streptococcus agalactiae NOT DETECTED NOT DETECTED Final   Streptococcus pneumoniae NOT DETECTED NOT DETECTED Final   Streptococcus pyogenes NOT DETECTED NOT DETECTED Final   Acinetobacter baumannii NOT DETECTED NOT DETECTED Final   Enterobacteriaceae species NOT DETECTED NOT DETECTED Final   Enterobacter cloacae complex NOT DETECTED NOT DETECTED Final   Escherichia coli NOT DETECTED NOT DETECTED Final   Klebsiella oxytoca NOT DETECTED NOT DETECTED Final   Klebsiella pneumoniae NOT DETECTED NOT DETECTED Final   Proteus species NOT DETECTED NOT DETECTED Final   Serratia marcescens NOT DETECTED NOT DETECTED Final   Haemophilus influenzae NOT DETECTED NOT DETECTED Final   Neisseria meningitidis NOT DETECTED NOT DETECTED Final   Pseudomonas aeruginosa NOT DETECTED NOT DETECTED Final   Candida albicans NOT DETECTED NOT DETECTED Final   Candida glabrata NOT DETECTED NOT DETECTED Final   Candida krusei NOT DETECTED NOT DETECTED Final   Candida parapsilosis NOT DETECTED NOT DETECTED Final   Candida tropicalis NOT DETECTED NOT DETECTED Final    Comment: Performed at [facility], [place] [address], [place], [place] [address]  Culture, blood (Routine X 2) w Reflex to ID Panel     Status: Abnormal   Collection Time: 10/30/19 10:36 PM   Specimen: BLOOD RIGHT ARM  Result Value Ref Range Status   Specimen Description   Final    BLOOD RIGHT ARM Performed at [facility], [address] [place], [place] [address]    Special Requests   Final    BOTTLES DRAWN AEROBIC AND ANAEROBIC Blood Culture adequate volume Performed at [facility], [address] [place], [place] [address]    Culture  Setup Time   Final    IN BOTH AEROBIC AND ANAEROBIC BOTTLES GRAM POSITIVE COCCI IN CLUSTERS CRITICAL RESULT CALLED TO, READ BACK BY AND VERIFIED WITH: Jennye Boroughs RN 10/31/19 2153 JDW  Culture (A)  Final     STAPHYLOCOCCUS AUREUS SUSCEPTIBILITIES PERFORMED ON PREVIOUS CULTURE WITHIN THE LAST 5 DAYS. Performed at [facility], [place] [address] [place], [place] [address]    Report Status 11/03/2019 FINAL  Final  Urine culture     Status: Abnormal   Collection Time: 11/01/19 10:09 AM   Specimen: In/Out Cath Urine  Result Value Ref Range Status   Specimen Description   Final    IN/OUT CATH URINE Performed at [facility], [address] [place], [place] [address]    Special Requests   Final    NONE Performed at [facility], [address] [place], [place] [address]    Culture (A)  Final    900 COLONIES/mL METHICILLIN RESISTANT STAPHYLOCOCCUS AUREUS   Report Status 11/03/2019 FINAL  Final   Organism ID, Bacteria METHICILLIN RESISTANT STAPHYLOCOCCUS AUREUS (A)  Final      Susceptibility   Methicillin resistant staphylococcus aureus - MIC*    CIPROFLOXACIN <=0.5 SENSITIVE Sensitive     GENTAMICIN <=0.5 SENSITIVE Sensitive     NITROFURANTOIN <=16 SENSITIVE Sensitive     OXACILLIN >=4 RESISTANT Resistant     TETRACYCLINE <=1 SENSITIVE Sensitive     VANCOMYCIN 1 SENSITIVE Sensitive     TRIMETH/SULFA <=10 SENSITIVE Sensitive     CLINDAMYCIN <=0.25 SENSITIVE Sensitive     RIFAMPIN <=0.5 SENSITIVE Sensitive     Inducible Clindamycin NEGATIVE Sensitive     * 900 COLONIES/mL METHICILLIN RESISTANT STAPHYLOCOCCUS AUREUS  Blood Culture (routine x 2)     Status: None (Preliminary result)   Collection Time: 11/01/19 10:15 AM   Specimen: Left Antecubital; Blood  Result Value Ref Range Status   Specimen Description   Final    LEFT ANTECUBITAL Performed at [facility], [address] [place], [place] [address]    Special Requests   Final    BOTTLES DRAWN AEROBIC AND ANAEROBIC Blood Culture results may not be optimal due to an inadequate volume of blood received in culture bottles Performed at [facility], [address] [place], [place]  [address]    Culture   Final    NO GROWTH 4 DAYS Performed at [facility], [address] [place], [place] [address]    Report Status PENDING  Incomplete  Blood Culture (routine x 2)     Status: Abnormal   Collection Time: 11/01/19 10:20 AM   Specimen: BLOOD RIGHT FOREARM  Result Value Ref Range Status   Specimen Description   Final    BLOOD RIGHT FOREARM Performed at [facility], [address] [place], [place] [address]    Special Requests   Final    BOTTLES DRAWN AEROBIC AND ANAEROBIC Blood Culture results may not be optimal due to an inadequate volume of blood received in culture bottles Performed at [facility], [address] [place], [place] [address]    Culture  Setup Time   Final    GRAM POSITIVE COCCI IN CLUSTERS AEROBIC BOTTLE ONLY CRITICAL RESULT CALLED TO, READ BACK BY AND VERIFIED WITH: PHARMD A PHAM [phone] AT 8003 BY CM    Culture (A)  Final    STAPHYLOCOCCUS AUREUS SUSCEPTIBILITIES PERFORMED ON PREVIOUS CULTURE WITHIN THE LAST 5 DAYS. Performed at [facility], [place] [address] [place], [place] [address]    Report Status 11/04/2019 FINAL  Final  SARS CORONAVIRUS 2 (TAT  6-24 HRS) Nasopharyngeal Nasopharyngeal Swab     Status: None   Collection Time: 11/01/19 10:55 AM   Specimen: Nasopharyngeal Swab  Result Value Ref Range Status   SARS Coronavirus 2 NEGATIVE NEGATIVE Final    Comment: (NOTE) SARS-CoV-2 target nucleic acids are NOT DETECTED. The SARS-CoV-2 RNA is generally detectable in upper and lower respiratory specimens during the acute phase of infection. Negative results do not preclude SARS-CoV-2 infection, do not rule out co-infections with other pathogens, and should not be used as the sole basis for treatment or other patient management decisions. Negative results must be combined with clinical observations, patient history, and epidemiological information. The expected result is Negative. Fact Sheet for  Patients: SugarRoll.be Fact Sheet for Healthcare Providers: https://www.woods-mathews.com/ This test is not yet approved or cleared by the [place] FDA and  has been authorized for detection and/or diagnosis of SARS-CoV-2 by FDA under an Emergency Use Authorization (EUA). This EUA will remain  in effect (meaning this test can be used) for the duration of the COVID-19 declaration under Section 56 4(b)(1) of the Act, 21 U.S.C. section 360bbb-3(b)(1), unless the authorization is terminated or revoked sooner. Performed at [facility], [place] [address], [place], [place] [address]   Culture, blood (Routine X 2) w Reflex to ID Panel     Status: None (Preliminary result)   Collection Time: 11/03/19  6:02 AM   Specimen: BLOOD  Result Value Ref Range Status   Specimen Description   Final    BLOOD LEFT ASSIST CONTROL Performed at [place] [address], [place], [place] [address]    Special Requests   Final    BOTTLES DRAWN AEROBIC AND ANAEROBIC Blood Culture results may not be optimal due to an excessive volume of blood received in culture bottles Performed at [place] [address] [place], [place] [address]    Culture   Final    NO GROWTH 2 DAYS Performed at [facility] [address] [place], [place] [address]    Report Status PENDING  Incomplete     Labs: Basic Metabolic Panel: Recent Labs  Lab 10/30/19 2023 11/01/19 1008 11/02/19 0504 11/03/19 0602 11/05/19 0547  NA 137 137 139 141 140  K 3.7 3.8 3.5 3.6 3.6  CL 99 101 106 106 104  CO2 26 26 25 26 26   GLUCOSE 116* 117* 101* 114* 104*  BUN 14 12 12 8 12   CREATININE 1.23 1.03 0.82 0.74 0.99  CALCIUM 9.1 9.1 8.3* 8.7* 8.7*  PHOS  --   --   --  3.3  --    Liver Function Tests: Recent Labs  Lab 10/30/19 2023 11/01/19 1008 11/02/19 0504 11/03/19 0602  AST 43* 55* 33  --   ALT 56* 79* 61*  --   ALKPHOS 90 104  84  --   BILITOT 2.7* 2.6* 2.5*  --   PROT 8.4* 7.6 6.6  --   ALBUMIN 3.9 3.5 2.9* 3.0*   Recent Labs  Lab 10/30/19 2023  LIPASE 28   No results for input(s): AMMONIA in the last 168 hours. CBC: Recent Labs  Lab 10/30/19 2023 11/01/19 1008 11/02/19 0504 11/03/19 0602  WBC 21.0* 21.0* 15.8* 16.6*  NEUTROABS  --  15.0*  --  11.4*  HGB 15.5 14.5 12.8* 12.7*  HCT 45.7 43.5 38.4* 38.2*  MCV 96.2 96.9 98.2 97.4  PLT 199 227 191 249   Cardiac Enzymes: No results for input(s):  CKTOTAL, CKMB, CKMBINDEX, TROPONINI in the last 168 hours. BNP: BNP (last 3 results) No results for input(s): BNP in the last 8760 hours.  ProBNP (last 3 results) No results for input(s): PROBNP in the last 8760 hours.  CBG: No results for input(s): GLUCAP in the last 168 hours.     Signed:  Nita Sells MD   [facility] 11/05/2019, 4:06 PM

## 2019-11-05 NOTE — Progress Notes (Signed)
[facility] for Infectious Disease   Reason for visit: Follow up on bacteremia  Interval History: TEE negative for vegetation, afebrile and no complaints.  Feels much better than on admission.   Day 5 vancomycin Repeat blood culture 2/27 remains ngtd    Physical Exam: Constitutional:  Vitals:   11/05/19 1400 11/05/19 1428  BP: 116/76 135/89  Pulse:  67  Resp: 13   Temp:  98.1 F (36.7 C)  SpO2: 100% 100%   patient appears in NAD Respiratory: Normal respiratory effort; CTA B Cardiovascular: RRR GI: soft, nt, nd  Review of Systems: Constitutional: negative for fevers and chills Gastrointestinal: negative for diarrhea  Lab Results  Component Value Date   WBC 16.6 (H) 11/03/2019   HGB 12.7 (L) 11/03/2019   HCT 38.2 (L) 11/03/2019   MCV 97.4 11/03/2019   PLT 249 11/03/2019    Lab Results  Component Value Date   CREATININE 0.99 11/05/2019   BUN 12 11/05/2019   NA 140 11/05/2019   K 3.6 11/05/2019   CL 104 11/05/2019   CO2 26 11/05/2019    Lab Results  Component Value Date   ALT 61 (H) 11/02/2019   AST 33 11/02/2019   ALKPHOS 84 11/02/2019     Microbiology: Recent Results (from the past 240 hour(s))  Urine Culture     Status: Abnormal   Collection Time: 10/30/19  8:23 PM   Specimen: Urine, Random  Result Value Ref Range Status   Specimen Description   Final    URINE, RANDOM Performed at [facility], [address] [place], [place] [address]    Special Requests   Final    NONE Performed at [facility], [address] [place], [place] [address]    Culture (A)  Final    <10,000 COLONIES/mL INSIGNIFICANT GROWTH Performed at [facility], [address], [place], [place] [address]    Report Status 11/01/2019 FINAL  Final  Culture, blood (Routine X 2) w Reflex to ID Panel     Status: Abnormal   Collection Time: 10/30/19 10:29 PM   Specimen: BLOOD LEFT ARM  Result Value Ref Range Status   Specimen Description    Final    BLOOD LEFT ARM Performed at [facility], [address] [place], [place] [address]    Special Requests   Final    BOTTLES DRAWN AEROBIC AND ANAEROBIC Blood Culture adequate volume Performed at [facility], [address] [place], [place] [address]    Culture  Setup Time   Final    GRAM POSITIVE COCCI IN CLUSTERS IN BOTH AEROBIC AND ANAEROBIC BOTTLES CRITICAL RESULT CALLED TO, READ BACK BY AND VERIFIED WITH: Dahlia Bailiff RN [id number] 11/01/2019 Mena Goes Performed at [facility], [place] [address], [place], [place] [address]    Culture METHICILLIN RESISTANT STAPHYLOCOCCUS AUREUS (A)  Final   Report Status 11/03/2019 FINAL  Final   Organism ID, Bacteria METHICILLIN RESISTANT STAPHYLOCOCCUS AUREUS  Final      Susceptibility   Methicillin resistant staphylococcus aureus - MIC*    CIPROFLOXACIN <=0.5 SENSITIVE Sensitive     ERYTHROMYCIN >=8 RESISTANT Resistant     GENTAMICIN <=0.5 SENSITIVE Sensitive     OXACILLIN >=4 RESISTANT Resistant     TETRACYCLINE <=1 SENSITIVE Sensitive     VANCOMYCIN 1 SENSITIVE Sensitive     TRIMETH/SULFA <=10 SENSITIVE Sensitive     CLINDAMYCIN <=0.25 SENSITIVE Sensitive  RIFAMPIN <=0.5 SENSITIVE Sensitive     Inducible Clindamycin NEGATIVE Sensitive     * METHICILLIN RESISTANT STAPHYLOCOCCUS AUREUS  Blood Culture ID Panel (Reflexed)     Status: Abnormal   Collection Time: 10/30/19 10:29 PM  Result Value Ref Range Status   Enterococcus species NOT DETECTED NOT DETECTED Final   Listeria monocytogenes NOT DETECTED NOT DETECTED Final   Staphylococcus species DETECTED (A) NOT DETECTED Final    Comment: CRITICAL RESULT CALLED TO, READ BACK BY AND VERIFIED WITH: Dahlia Bailiff RN [phone] 11/01/2019 T. TYSOR    Staphylococcus aureus (BCID) DETECTED (A) NOT DETECTED Final    Comment: Methicillin (oxacillin)-resistant Staphylococcus aureus (MRSA). MRSA is predictably resistant to beta-lactam antibiotics (except ceftaroline).  Preferred therapy is vancomycin unless clinically contraindicated. Patient requires contact precautions if  hospitalized. CRITICAL RESULT CALLED TO, READ BACK BY AND VERIFIED WITH: Dahlia Bailiff RN [phone] 11/01/2019 T. TYSOR    Methicillin resistance DETECTED (A) NOT DETECTED Final    Comment: CRITICAL RESULT CALLED TO, READ BACK BY AND VERIFIED WITH: Dahlia Bailiff RN [phone] 11/01/2019 T. TYSOR    Streptococcus species NOT DETECTED NOT DETECTED Final   Streptococcus agalactiae NOT DETECTED NOT DETECTED Final   Streptococcus pneumoniae NOT DETECTED NOT DETECTED Final   Streptococcus pyogenes NOT DETECTED NOT DETECTED Final   Acinetobacter baumannii NOT DETECTED NOT DETECTED Final   Enterobacteriaceae species NOT DETECTED NOT DETECTED Final   Enterobacter cloacae complex NOT DETECTED NOT DETECTED Final   Escherichia coli NOT DETECTED NOT DETECTED Final   Klebsiella oxytoca NOT DETECTED NOT DETECTED Final   Klebsiella pneumoniae NOT DETECTED NOT DETECTED Final   Proteus species NOT DETECTED NOT DETECTED Final   Serratia marcescens NOT DETECTED NOT DETECTED Final   Haemophilus influenzae NOT DETECTED NOT DETECTED Final   Neisseria meningitidis NOT DETECTED NOT DETECTED Final   Pseudomonas aeruginosa NOT DETECTED NOT DETECTED Final   Candida albicans NOT DETECTED NOT DETECTED Final   Candida glabrata NOT DETECTED NOT DETECTED Final   Candida krusei NOT DETECTED NOT DETECTED Final   Candida parapsilosis NOT DETECTED NOT DETECTED Final   Candida tropicalis NOT DETECTED NOT DETECTED Final    Comment: Performed at [facility], [place] [address] [place], [place] [address]  Culture, blood (Routine X 2) w Reflex to ID Panel     Status: Abnormal   Collection Time: 10/30/19 10:36 PM   Specimen: BLOOD RIGHT ARM  Result Value Ref Range Status   Specimen Description   Final    BLOOD RIGHT ARM Performed at [facility], [address] [place], [place] [address]    Special  Requests   Final    BOTTLES DRAWN AEROBIC AND ANAEROBIC Blood Culture adequate volume Performed at [facility], [address] [place], [place] [address]    Culture  Setup Time   Final    IN BOTH AEROBIC AND ANAEROBIC BOTTLES GRAM POSITIVE COCCI IN CLUSTERS CRITICAL RESULT CALLED TO, READ BACK BY AND VERIFIED WITH: Jennye Boroughs RN 10/31/19 2153 JDW    Culture (A)  Final    STAPHYLOCOCCUS AUREUS SUSCEPTIBILITIES PERFORMED ON PREVIOUS CULTURE WITHIN THE LAST 5 DAYS. Performed at [facility], [place] [address] [place], [place] [address]    Report Status 11/03/2019 FINAL  Final  Urine culture     Status: Abnormal   Collection Time: 11/01/19 10:09 AM   Specimen: In/Out Cath Urine  Result Value Ref Range Status   Specimen Description  Final    IN/OUT CATH URINE Performed at [facility], [address] [place], [place] [address]    Special Requests   Final    NONE Performed at [facility], [address] [place], [place] [address]    Culture (A)  Final    900 COLONIES/mL METHICILLIN RESISTANT STAPHYLOCOCCUS AUREUS   Report Status 11/03/2019 FINAL  Final   Organism ID, Bacteria METHICILLIN RESISTANT STAPHYLOCOCCUS AUREUS (A)  Final      Susceptibility   Methicillin resistant staphylococcus aureus - MIC*    CIPROFLOXACIN <=0.5 SENSITIVE Sensitive     GENTAMICIN <=0.5 SENSITIVE Sensitive     NITROFURANTOIN <=16 SENSITIVE Sensitive     OXACILLIN >=4 RESISTANT Resistant     TETRACYCLINE <=1 SENSITIVE Sensitive     VANCOMYCIN 1 SENSITIVE Sensitive     TRIMETH/SULFA <=10 SENSITIVE Sensitive     CLINDAMYCIN <=0.25 SENSITIVE Sensitive     RIFAMPIN <=0.5 SENSITIVE Sensitive     Inducible Clindamycin NEGATIVE Sensitive     * 900 COLONIES/mL METHICILLIN RESISTANT STAPHYLOCOCCUS AUREUS  Blood Culture (routine x 2)     Status: None (Preliminary result)   Collection Time: 11/01/19 10:15 AM   Specimen: Left Antecubital; Blood  Result Value Ref Range  Status   Specimen Description   Final    LEFT ANTECUBITAL Performed at [facility], [address] [place], [place] [address]    Special Requests   Final    BOTTLES DRAWN AEROBIC AND ANAEROBIC Blood Culture results may not be optimal due to an inadequate volume of blood received in culture bottles Performed at [facility], [address] [place], [place] [address]    Culture   Final    NO GROWTH 4 DAYS Performed at [facility], [address], [place], [place] [address]    Report Status PENDING  Incomplete  Blood Culture (routine x 2)     Status: Abnormal   Collection Time: 11/01/19 10:20 AM   Specimen: BLOOD RIGHT FOREARM  Result Value Ref Range Status   Specimen Description   Final    BLOOD RIGHT FOREARM Performed at [facility], [address] [place], [place] [address]    Special Requests   Final    BOTTLES DRAWN AEROBIC AND ANAEROBIC Blood Culture results may not be optimal due to an inadequate volume of blood received in culture bottles Performed at [facility], [address] [place], [place] [address]    Culture  Setup Time   Final    GRAM POSITIVE COCCI IN CLUSTERS AEROBIC BOTTLE ONLY CRITICAL RESULT CALLED TO, READ BACK BY AND VERIFIED WITH: PHARMD A PHAM [phone] AT [id number] BY CM    Culture (A)  Final    STAPHYLOCOCCUS AUREUS SUSCEPTIBILITIES PERFORMED ON PREVIOUS CULTURE WITHIN THE LAST 5 DAYS. Performed at [facility], [place] [address] [place],  [address]    Report Status 11/04/2019 FINAL  Final  SARS CORONAVIRUS 2 (TAT 6-24 HRS) Nasopharyngeal Nasopharyngeal Swab     Status: None   Collection Time: 11/01/19 10:55 AM   Specimen: Nasopharyngeal Swab  Result Value Ref Range Status   SARS Coronavirus 2 NEGATIVE NEGATIVE Final    Comment: (NOTE) SARS-CoV-2 target nucleic acids are NOT DETECTED. The SARS-CoV-2 RNA is generally detectable in upper and lower respiratory specimens during the acute  phase of infection. Negative results do not preclude SARS-CoV-2 infection, do  not rule out co-infections with other pathogens, and should not be used as the sole basis for treatment or other patient management decisions. Negative results must be combined with clinical observations, patient history, and epidemiological information. The expected result is Negative. Fact Sheet for Patients: SugarRoll.be Fact Sheet for Healthcare Providers: https://www.woods-mathews.com/ This test is not yet approved or cleared by the [place] FDA and  has been authorized for detection and/or diagnosis of SARS-CoV-2 by FDA under an Emergency Use Authorization (EUA). This EUA will remain  in effect (meaning this test can be used) for the duration of the COVID-19 declaration under Section 56 4(b)(1) of the Act, 21 U.S.C. section 360bbb-3(b)(1), unless the authorization is terminated or revoked sooner. Performed at [facility], [place] [address] [place], [place] [address]   Culture, blood (Routine X 2) w Reflex to ID Panel     Status: None (Preliminary result)   Collection Time: 11/03/19  6:02 AM   Specimen: BLOOD  Result Value Ref Range Status   Specimen Description   Final    BLOOD LEFT ASSIST CONTROL Performed at [facility] [address], [place], [place] [address]    Special Requests   Final    BOTTLES DRAWN AEROBIC AND ANAEROBIC Blood Culture results may not be optimal due to an excessive volume of blood received in culture bottles Performed at [facility] [address], [place], [place] [address]    Culture   Final    NO GROWTH 2 DAYS Performed at [facility] [address], [place], [place] [address]    Report Status PENDING  Incomplete    Impression/Plan:  1. MRSA bacteremia - he continues to feel much better, TEE negative for vegetation, MRIs without infection.   He should continue with  vancomycin IV through March 12th.    2.  Medication monitoring - labs per home health protocol twice weekly  3.  Access - picc line ordered and ok from ID standpoint for discharge once in place  No need for ID follow up unless concerns He can have his picc line removed by Home Health after the last dose on 3/12.    I will sign off, call with any questions.

## 2019-11-05 NOTE — Progress Notes (Signed)
  Echocardiogram Echocardiogram Transesophageal has been performed.  John [facility] 11/05/2019, 1:29 PM

## 2019-11-05 NOTE — TOC Initial Note (Signed)
Transition of Care [facility]) - Initial/Assessment Note    Patient Details  Name: NARON COBURN MRN: [id number] Date of Birth: Dec 13, 1950  Transition of Care [facility]) CM/SW Contact:    Trish Mage, LCSW Phone Number: 11/05/2019, 4:11 PM  Clinical Narrative:  Patient in need of IV abx at d/c.  Pam with [employer] had been alerted by ID, and she contacted me to offer help.  Of course I took her up on her generous offer.  She will get meds in place and is planning on contacting [facility] for [facility] RN. Patient will need order for [facility] RN at d/c. TOC will continue to follow during the course of hospitalization.                  Expected Discharge Plan: [place] Barriers to Discharge: No Barriers Identified   Patient Goals and CMS Choice        Expected Discharge Plan and Services Expected Discharge Plan: [place]         Expected Discharge Date: 11/05/19                                    Prior Living Arrangements/Services                       Activities of Daily Living Home Assistive Devices/Equipment: None ADL Screening (condition at time of admission) Patient's cognitive ability adequate to safely complete daily activities?: Yes Is the patient deaf or have difficulty hearing?: No Does the patient have difficulty seeing, even when wearing glasses/contacts?: No Does the patient have difficulty concentrating, remembering, or making decisions?: No Patient able to express need for assistance with ADLs?: Yes Does the patient have difficulty dressing or bathing?: No Independently performs ADLs?: Yes (appropriate for developmental age) Does the patient have difficulty walking or climbing stairs?: No Weakness of Legs: None Weakness of Arms/Hands: None  Permission Sought/Granted                  Emotional Assessment              Admission diagnosis:  Kidney infection [N15.9] MRSA bacteremia [R78.81, B95.62] Sepsis due to  methicillin resistant Staphylococcus aureus (MRSA), unspecified whether acute organ dysfunction present ([place]) [A41.02] Bacteremia [R78.81] Patient Active Problem List   Diagnosis Date Noted  . MRSA bacteremia 11/01/2019  . Bacteremia 11/01/2019   PCP:  Crist Infante, MD Pharmacy:   [place], [place] - [address] [place] [address] Phone: [phone] Fax: [place], [place] - [address] [place] [address] Phone: [phone] Fax: [phone]     Social Determinants of Health (SDOH) Interventions    Readmission Risk Interventions No flowsheet data found.

## 2019-11-05 NOTE — CV Procedure (Signed)
   Transesophageal Echocardiogram  Indications: Bacteremia  Time out performed Propofol utilized under anesthesia's direction   findings:  Left Ventricle: Normal left ventricular ejection fraction 55%  Mitral Valve: There is a small, extremely thin filamentous echodensity along the atrial surface of the mitral valve leaflet.  This does not have the typical appearance of a valvular vegetation.  Aortic Valve: Normal valve  Tricuspid Valve: Normal, trivial tricuspid regurgitation  Left Atrium: No left atrial appendage thrombus  Impression: Negative endocarditis   Candee Furbish, MD

## 2019-11-05 NOTE — Progress Notes (Signed)
PHARMACY CONSULT NOTE FOR:  OUTPATIENT  PARENTERAL ANTIBIOTIC THERAPY (OPAT)  Indication: MRSA bacteremia Regimen: Vancomycin 1g IV q12 End date: 11/16/19 Monitoring: Patient will need vancomycin trough drawn tomorrow 11/06/19 prior to NOON dose - goal 15-20 mcg/mL  IV antibiotic discharge orders are pended. To discharging provider:  please sign these orders via discharge navigator,  Select New Orders & click on the button choice - Manage This Unsigned Work.     Thank you for allowing pharmacy to be a part of this patient's care.  Kara Mead 11/05/2019, 3:23 PM

## 2019-11-05 NOTE — Progress Notes (Signed)
Patient getting TEE today.  Repeat blood culture no growth at 48 hours and no current systemic signs.  MRIs unrevealing.   I will go ahead and place the picc line order to be done today when he returns and OK from ID standpoint for discharge today. Duration of vancomycin will depend on TEE results - 2 weeks through March 12th or 6 weeks through April 9th.  Thayer Headings, MD

## 2019-11-05 NOTE — Anesthesia Procedure Notes (Signed)
Procedure Name: MAC Date/Time: 11/05/2019 11:57 AM Performed by: Harden Mo, CRNA Pre-anesthesia Checklist: Patient identified, Emergency Drugs available, Suction available and Patient being monitored Patient Re-evaluated:Patient Re-evaluated prior to induction Oxygen Delivery Method: Nasal cannula Preoxygenation: Pre-oxygenation with 100% oxygen Induction Type: IV induction Placement Confirmation: positive ETCO2 and breath sounds checked- equal and bilateral Dental Injury: Teeth and Oropharynx as per pre-operative assessment

## 2019-11-05 NOTE — Progress Notes (Signed)
Hospitalist progress note   Patient from home, Patient going possibly home, Dispo unclear currently  John Houston [id number] DOB: 07-17-1951 DOA: 11/01/2019  PCP: Crist Infante, MD   Narrative:  69 year old white male prior admission for chest pain 2009, left hallux pain in the past reflux syndrome Gilbert's syndrome impaired fasting glucose probable depression Initially came to emergency room 2/23 with flank pain-because of prior history of kidney stones sent to ED for CT scan-he was started on Keflex prior to coming to the ED on 2/23-was given Rocephin and discharged home with Cipro in addition to the Keflex Blood cultures apparently returned 2/2 bottles 2/23 Staph aureus which was MRSA resistant  White count was found to be 20 lactic acid 0.9 because of persisting left foot pain he had an x-ray which showed ill-defined soft tissue calcification  Data Reviewed:  Potassium 3.6 BUN/creatinine 12/0.8-->8/0.7-->12/0.99 LFTs normal MRI performed 2/25 showed possible gout 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 Echo 2/26 no veg  Assessment & Plan:  MRSA bacteremia with urine infection Continue vancomycin-MRI L spine/Foot negative-scheduled for TEE today Follow-up with ID once performed to determine duration of antibiotics + - PICC line Gout Now on colchicine 0.3 twice daily - continue Norco short-term improved with Rx and bearing weight Prior chest pain None at this time Gilbert's syndrome History of-mild elevation of ALT to 61 no further work-up Impaired fasting glucose Reflux Infectious disease  Subjective:  No new issues sitting up in bed leg pain is much better no fever no chills Consultants:   Infectious disease  Objective: Vitals:   11/04/19 0629 11/04/19 1428 11/04/19 2018 11/05/19 0526  BP: 132/84 (!) 131/92 120/77 111/78  Pulse: 70 74 80 71  Resp: 18 20 17 16   Temp: 98.7 F (37.1 C) 98.9 F (37.2 C) 98.4 F (36.9 C) 98.6 F (37 C)   TempSrc: Oral Oral  Oral  SpO2: 99% 98% 98% 97%    Intake/Output Summary (Last 24 hours) at 11/05/2019 1011 Last data filed at 11/05/2019 0925 Gross per 24 hour  Intake 0 ml  Output 0 ml  Net 0 ml   There were no vitals filed for this visit.  Examination:  E alert coherent no murmur neck soft supple Chest clear no added sound S1-S2 no murmur Abdomen soft no rebound Neurologically intact  Scheduled Meds: . colchicine  0.3 mg Oral BID  . enoxaparin (LOVENOX) injection  40 mg Subcutaneous Q24H  . tamsulosin  0.4 mg Oral Daily   Continuous Infusions: . sodium chloride 100 mL/hr at 11/05/19 0820  . sodium chloride    . sodium chloride    . vancomycin 1,000 mg (11/05/19 0002)     LOS: 4 days   Time spent: [place], MD [facility]  11/05/2019, 10:11 AM

## 2019-11-05 NOTE — Anesthesia Preprocedure Evaluation (Addendum)
Anesthesia Evaluation  Patient identified by MRN, date of birth, ID band Patient awake  General Assessment Comment:Bacteremia   Reviewed: Allergy & Precautions, NPO status , Patient's Chart, lab work & pertinent test results  Airway Mallampati: II  TM Distance: >3 FB Neck ROM: Full    Dental  (+) Dental Advisory Given, Partial Upper   Pulmonary former smoker,    Pulmonary exam normal breath sounds clear to auscultation       Cardiovascular negative cardio ROS Normal cardiovascular exam Rhythm:Regular Rate:Normal     Neuro/Psych negative neurological ROS  negative psych ROS   GI/Hepatic Neg liver ROS, Gilbert's syndrome   Endo/Other  negative endocrine ROS  Renal/GU negative Renal ROS     Musculoskeletal negative musculoskeletal ROS (+)   Abdominal   Peds  Hematology  (+) Blood dyscrasia, anemia ,   Anesthesia Other Findings Day of surgery medications reviewed with the patient.  Reproductive/Obstetrics                            Anesthesia Physical Anesthesia Plan  ASA: II  Anesthesia Plan: MAC   Post-op Pain Management:    Induction: Intravenous  PONV Risk Score and Plan: 1 and Propofol infusion and Treatment may vary due to age or medical condition  Airway Management Planned: Natural Airway and Nasal Cannula  Additional Equipment:   Intra-op Plan:   Post-operative Plan:   Informed Consent: I have reviewed the patients History and Physical, chart, labs and discussed the procedure including the risks, benefits and alternatives for the proposed anesthesia with the patient or authorized representative who has indicated his/her understanding and acceptance.     Dental advisory given  Plan Discussed with: CRNA  Anesthesia Plan Comments:        Anesthesia Quick Evaluation

## 2019-11-05 NOTE — H&P (View-Only) (Signed)
Patient getting TEE today.  Repeat blood culture no growth at 48 hours and no current systemic signs.  MRIs unrevealing.   I will go ahead and place the picc line order to be done today when he returns and OK from ID standpoint for discharge today. Duration of vancomycin will depend on TEE results - 2 weeks through March 12th or 6 weeks through April 9th.  Thayer Headings, MD

## 2019-11-05 NOTE — Care Management Important Message (Signed)
Important Message  Patient Details IM Letter given to Roque Lias SW Case Manager to present to the Patient Name: John Houston MRN: [id number] Date of Birth: March 23, 1951   Medicare Important Message Given:  Yes     Kerin Salen 11/05/2019, 10:48 AM

## 2019-11-05 NOTE — Progress Notes (Signed)
Received order for PICC  

## 2019-11-05 NOTE — Interval H&P Note (Signed)
History and Physical Interval Note:  11/05/2019 12:46 PM  John Houston  has presented today for surgery, with the diagnosis of bacteremia.  The various methods of treatment have been discussed with the patient and family. After consideration of risks, benefits and other options for treatment, the patient has consented to  Procedure(s): TRANSESOPHAGEAL ECHOCARDIOGRAM (TEE) (N/A) as a surgical intervention.  The patient's history has been reviewed, patient examined, no change in status, stable for surgery.  I have reviewed the patient's chart and labs.  Questions were answered to the patient's satisfaction.     [employer]

## 2019-11-05 NOTE — Transfer of Care (Signed)
Immediate Anesthesia Transfer of Care Note  Patient: John Houston  Procedure(s) Performed: TRANSESOPHAGEAL ECHOCARDIOGRAM (TEE) (N/A )  Patient Location: Endoscopy Unit  Anesthesia Type:MAC  Level of Consciousness: awake, alert  and oriented  Airway & Oxygen Therapy: Patient Spontanous Breathing and Patient connected to nasal cannula oxygen  Post-op Assessment: Report given to RN, Post -op Vital signs reviewed and stable and Patient moving all extremities X 4  Post vital signs: Reviewed and stable  Last Vitals:  Vitals Value Taken Time  BP    Temp    Pulse    Resp    SpO2      Last Pain:  Vitals:   11/05/19 1206  TempSrc: Temporal  PainSc: 0-No pain      Patients Stated Pain Goal: 2 (21/58/72 7618)  Complications: No apparent anesthesia complications

## 2019-11-06 LAB — CULTURE, BLOOD (ROUTINE X 2): Culture: NO GROWTH

## 2019-11-06 LAB — VANCOMYCIN, TROUGH: Vancomycin Tr: 16 ug/mL (ref 15–20)

## 2019-11-06 MED ORDER — SODIUM CHLORIDE 0.9% FLUSH: 10.0000 mL | INTRAVENOUS | Status: DC | PRN

## 2019-11-06 MED ORDER — SODIUM CHLORIDE 0.9% FLUSH: 10.0000 mL | Freq: Two times a day (BID) | INTRAVENOUS | Status: DC

## 2019-11-06 MED ORDER — CHLORHEXIDINE GLUCONATE CLOTH 2 % EX PADS: 6.0000 | MEDICATED_PAD | Freq: Every day | CUTANEOUS | Status: DC

## 2019-11-06 NOTE — Progress Notes (Signed)
Peripherally Inserted Central Catheter/Midline Placement  The IV Nurse has discussed with the patient and/or persons authorized to consent for the patient, the purpose of this procedure and the potential benefits and risks involved with this procedure.  The benefits include less needle sticks, lab draws from the catheter, and the patient may be discharged home with the catheter. Risks include, but not limited to, infection, bleeding, blood clot (thrombus formation), and puncture of an artery; nerve damage and irregular heartbeat and possibility to perform a PICC exchange if needed/ordered by physician.  Alternatives to this procedure were also discussed.  Bard Power PICC patient education guide, fact sheet on infection prevention and patient information card has been provided to patient /or left at bedside.    PICC/Midline Placement Documentation  PICC Single Lumen 11/06/19 PICC Right Brachial (Active)  Indication for Insertion or Continuance of Line Home intravenous therapies (PICC only) 11/06/19 1209  Exposed Catheter (cm) 0 cm 11/06/19 1209  Site Assessment Clean;Dry;Intact 11/06/19 1209  Line Status Flushed;Saline locked;Blood return noted 11/06/19 1209  Dressing Type Transparent 11/06/19 1209  Dressing Status Clean;Dry;Intact;Antimicrobial disc in place 11/06/19 1209  Dressing Change Due 11/13/19 11/06/19 1209       Gordan Payment 11/06/2019, 12:10 PM

## 2019-11-06 NOTE — Progress Notes (Addendum)
Pharmacy Antibiotic Note  John Houston is a 69 y.o. male admitted on 11/01/2019 with MRSA bacteremia.   Pharmacy has been consulted for vancomycin dosing. WBC 21 and patient afebrile. Noted that patient is now complaining of left foot pain and redness. SCr- 1.03 with CrCl 71 ml/min.   11/06/2019  Day 6 Vanc of total 16 - stop date 3/12 per ID  Afebrile  WBC 16.6 on 2/27, has not been checked since  SCr stable CrCl 72  Vanc Trough 16 at 1123, note that 1g vanc noted to have started at 1119 so not sure if this had effect on the trough at this time   Plan:  Vanc trough at goal 15-20 for bacteremia - continue Vanc 1g IV q12 for   OPAT completed 3/1  Would check another trough in a couple of days to make sure still therapeutic  Temp (24hrs), Avg:98.1 F (36.7 C), Min:97.7 F (36.5 C), Max:98.7 F (37.1 C)  Recent Labs  Lab 10/30/19 2023 10/30/19 2229 11/01/19 0955 11/01/19 1008 11/02/19 0504 11/03/19 0602 11/05/19 0547  WBC 21.0*  --   --  21.0* 15.8* 16.6*  --   CREATININE 1.23  --   --  1.03 0.82 0.74 0.99  LATICACIDVEN  --  1.2 0.9  --   --   --   --     Estimated Creatinine Clearance: 72.4 mL/min (by C-G formula based on SCr of 0.99 mg/dL).    No Known Allergies  Antimicrobials this admission: 2/25 Vanc>>  Dose adjustments this admission:  2/26 V 1500 q24>>1 gm q12 for AUC 461 29.6/12.5 w/ SCr 0.82  Microbiology results:  2/23 BCx2: 4/4 bottles MRSA 2/25 BCx2: sent 2/25 I/O UCx: 900 col SA   Thank you for allowing pharmacy to be a part of this patient's care.  Adrian Saran, PharmD, BCPS [phone] 11/06/2019 8:51 AM

## 2019-11-06 NOTE — Progress Notes (Signed)
Order placed again as urgent PICC lcomment placed that  patient is "imminent discharge" noted barrier to discharge is the PICC line.   Unable to page IV team as current pager number is not in service.  Bedside RN made aware of the above.

## 2019-11-06 NOTE — Progress Notes (Signed)
Patient was seen and examined at bedside no interval complaints.  No change in the plan compared to yesterday.  Discharge summary has been done on 11/05/2019.  Patient is stable for disposition home after PICC line placement.  Today's Vitals   11/05/19 1428 11/05/19 2054 11/05/19 2300 11/06/19 0650  BP: 135/89 122/81  123/82  Pulse: 67 66  70  Resp:  17  17  Temp: 98.1 F (36.7 C) 98.4 F (36.9 C)  98.7 F (37.1 C)  TempSrc: Oral   Oral  SpO2: 100% 98%  98%  Weight:      Height:      PainSc:   0-No pain    Body mass index is 25.07 kg/m.

## 2019-11-06 NOTE — Anesthesia Postprocedure Evaluation (Signed)
Anesthesia Post Note  Patient: John Houston  Procedure(s) Performed: TRANSESOPHAGEAL ECHOCARDIOGRAM (TEE) (N/A )     Patient location during evaluation: Endoscopy Anesthesia Type: MAC Level of consciousness: awake and alert Pain management: pain level controlled Vital Signs Assessment: post-procedure vital signs reviewed and stable Respiratory status: spontaneous breathing, nonlabored ventilation, respiratory function stable and patient connected to nasal cannula oxygen Cardiovascular status: stable and blood pressure returned to baseline Postop Assessment: no apparent nausea or vomiting Anesthetic complications: no    Last Vitals:  Vitals:   11/05/19 2054 11/06/19 0650  BP: 122/81 123/82  Pulse: 66 70  Resp: 17 17  Temp: 36.9 C 37.1 C  SpO2: 98% 98%    Last Pain:  Vitals:   11/06/19 0650  TempSrc: Oral  PainSc:                  Catalina Gravel

## 2019-11-06 NOTE — TOC Progression Note (Signed)
Transition of Care [facility]) - Progression Note    Patient Details  Name: John Houston MRN: [id number] Date of Birth: 05/16/1951  Transition [facility]) CM/SW Contact  Joaquin Courts, RN Phone Number: 11/06/2019, 10:10 AM  Clinical Narrative:   CM spoke with Carolynn Sayers and confirmed that everything is in place for home IV antibiotics.  Plan is for patient to receive his noon dose of antibiotics in the hospital and dc home after.  Of Note, patient is still waiting for PICC line placement which is delaying discharge.  CM spoke with charge RN regarding plan and charge RN reports she has spoken with IV team who is aware that patient is an imminent discharge.  CM asked if additional contact could be make with IV team to request that patient be prioritized for PICC placement.  Once PICC is in place patient can discharge after his noon antibiotics dose.      Expected Discharge Plan: [place] Barriers to Discharge: No Barriers Identified  Expected Discharge Plan and Services Expected Discharge Plan: [place] Choice: Home Health   Expected Discharge Date: 11/05/19                                     Social Determinants of Health (SDOH) Interventions    Readmission Risk Interventions No flowsheet data found.

## 2019-11-08 DIAGNOSIS — D72829 Elevated white blood cell count, unspecified: Secondary | ICD-10-CM | POA: Diagnosis not present

## 2019-11-08 DIAGNOSIS — Z792 Long term (current) use of antibiotics: Secondary | ICD-10-CM | POA: Diagnosis not present

## 2019-11-08 DIAGNOSIS — Z7901 Long term (current) use of anticoagulants: Secondary | ICD-10-CM | POA: Diagnosis not present

## 2019-11-08 DIAGNOSIS — R7881 Bacteremia: Secondary | ICD-10-CM | POA: Diagnosis not present

## 2019-11-08 DIAGNOSIS — Z452 Encounter for adjustment and management of vascular access device: Secondary | ICD-10-CM | POA: Diagnosis not present

## 2019-11-08 DIAGNOSIS — M4317 Spondylolisthesis, lumbosacral region: Secondary | ICD-10-CM | POA: Diagnosis not present

## 2019-11-08 DIAGNOSIS — Z9181 History of falling: Secondary | ICD-10-CM | POA: Diagnosis not present

## 2019-11-08 DIAGNOSIS — R7301 Impaired fasting glucose: Secondary | ICD-10-CM | POA: Diagnosis not present

## 2019-11-08 DIAGNOSIS — M4316 Spondylolisthesis, lumbar region: Secondary | ICD-10-CM | POA: Diagnosis not present

## 2019-11-08 DIAGNOSIS — M47817 Spondylosis without myelopathy or radiculopathy, lumbosacral region: Secondary | ICD-10-CM | POA: Diagnosis not present

## 2019-11-08 DIAGNOSIS — J9811 Atelectasis: Secondary | ICD-10-CM | POA: Diagnosis not present

## 2019-11-08 DIAGNOSIS — A4102 Sepsis due to Methicillin resistant Staphylococcus aureus: Secondary | ICD-10-CM | POA: Diagnosis not present

## 2019-11-08 DIAGNOSIS — Z87891 Personal history of nicotine dependence: Secondary | ICD-10-CM | POA: Diagnosis not present

## 2019-11-08 DIAGNOSIS — M5136 Other intervertebral disc degeneration, lumbar region: Secondary | ICD-10-CM | POA: Diagnosis not present

## 2019-11-08 DIAGNOSIS — M47816 Spondylosis without myelopathy or radiculopathy, lumbar region: Secondary | ICD-10-CM | POA: Diagnosis not present

## 2019-11-08 DIAGNOSIS — N200 Calculus of kidney: Secondary | ICD-10-CM | POA: Diagnosis not present

## 2019-11-08 DIAGNOSIS — K219 Gastro-esophageal reflux disease without esophagitis: Secondary | ICD-10-CM | POA: Diagnosis not present

## 2019-11-08 DIAGNOSIS — M4807 Spinal stenosis, lumbosacral region: Secondary | ICD-10-CM | POA: Diagnosis not present

## 2019-11-08 DIAGNOSIS — N390 Urinary tract infection, site not specified: Secondary | ICD-10-CM | POA: Diagnosis not present

## 2019-11-08 DIAGNOSIS — E882 Lipomatosis, not elsewhere classified: Secondary | ICD-10-CM | POA: Diagnosis not present

## 2019-11-08 DIAGNOSIS — I700 Atherosclerosis of aorta: Secondary | ICD-10-CM | POA: Diagnosis not present

## 2019-11-08 DIAGNOSIS — B9562 Methicillin resistant Staphylococcus aureus infection as the cause of diseases classified elsewhere: Secondary | ICD-10-CM | POA: Diagnosis not present

## 2019-11-08 DIAGNOSIS — M48061 Spinal stenosis, lumbar region without neurogenic claudication: Secondary | ICD-10-CM | POA: Diagnosis not present

## 2019-11-08 DIAGNOSIS — I081 Rheumatic disorders of both mitral and tricuspid valves: Secondary | ICD-10-CM | POA: Diagnosis not present

## 2019-11-08 DIAGNOSIS — M5137 Other intervertebral disc degeneration, lumbosacral region: Secondary | ICD-10-CM | POA: Diagnosis not present

## 2019-11-08 LAB — CULTURE, BLOOD (ROUTINE X 2): Culture: NO GROWTH

## 2019-11-12 DIAGNOSIS — Z452 Encounter for adjustment and management of vascular access device: Secondary | ICD-10-CM | POA: Diagnosis not present

## 2019-11-15 DIAGNOSIS — B9562 Methicillin resistant Staphylococcus aureus infection as the cause of diseases classified elsewhere: Secondary | ICD-10-CM | POA: Diagnosis not present

## 2019-11-15 DIAGNOSIS — R7881 Bacteremia: Secondary | ICD-10-CM | POA: Diagnosis not present

## 2019-11-16 DIAGNOSIS — A4902 Methicillin resistant Staphylococcus aureus infection, unspecified site: Secondary | ICD-10-CM | POA: Diagnosis not present

## 2019-11-16 DIAGNOSIS — R030 Elevated blood-pressure reading, without diagnosis of hypertension: Secondary | ICD-10-CM | POA: Diagnosis not present

## 2019-11-16 DIAGNOSIS — E785 Hyperlipidemia, unspecified: Secondary | ICD-10-CM | POA: Diagnosis not present

## 2019-11-16 DIAGNOSIS — R7301 Impaired fasting glucose: Secondary | ICD-10-CM | POA: Diagnosis not present

## 2019-11-16 DIAGNOSIS — M48061 Spinal stenosis, lumbar region without neurogenic claudication: Secondary | ICD-10-CM | POA: Diagnosis not present

## 2019-11-16 DIAGNOSIS — M1000 Idiopathic gout, unspecified site: Secondary | ICD-10-CM | POA: Diagnosis not present

## 2019-11-16 DIAGNOSIS — R7881 Bacteremia: Secondary | ICD-10-CM | POA: Diagnosis not present

## 2019-11-16 DIAGNOSIS — N401 Enlarged prostate with lower urinary tract symptoms: Secondary | ICD-10-CM | POA: Diagnosis not present

## 2019-11-16 DIAGNOSIS — D696 Thrombocytopenia, unspecified: Secondary | ICD-10-CM | POA: Diagnosis not present

## 2019-11-16 DIAGNOSIS — M109 Gout, unspecified: Secondary | ICD-10-CM | POA: Diagnosis not present

## 2019-11-16 DIAGNOSIS — K219 Gastro-esophageal reflux disease without esophagitis: Secondary | ICD-10-CM | POA: Diagnosis not present

## 2019-11-16 DIAGNOSIS — B9562 Methicillin resistant Staphylococcus aureus infection as the cause of diseases classified elsewhere: Secondary | ICD-10-CM | POA: Diagnosis not present

## 2020-01-07 DIAGNOSIS — M109 Gout, unspecified: Secondary | ICD-10-CM | POA: Diagnosis not present

## 2020-01-07 DIAGNOSIS — Z125 Encounter for screening for malignant neoplasm of prostate: Secondary | ICD-10-CM | POA: Diagnosis not present

## 2020-01-07 DIAGNOSIS — E7849 Other hyperlipidemia: Secondary | ICD-10-CM | POA: Diagnosis not present

## 2020-01-07 DIAGNOSIS — R7301 Impaired fasting glucose: Secondary | ICD-10-CM | POA: Diagnosis not present

## 2020-01-14 DIAGNOSIS — N401 Enlarged prostate with lower urinary tract symptoms: Secondary | ICD-10-CM | POA: Diagnosis not present

## 2020-01-14 DIAGNOSIS — F4320 Adjustment disorder, unspecified: Secondary | ICD-10-CM | POA: Diagnosis not present

## 2020-01-14 DIAGNOSIS — M545 Low back pain: Secondary | ICD-10-CM | POA: Diagnosis not present

## 2020-01-14 DIAGNOSIS — R7301 Impaired fasting glucose: Secondary | ICD-10-CM | POA: Diagnosis not present

## 2020-01-14 DIAGNOSIS — Z1339 Encounter for screening examination for other mental health and behavioral disorders: Secondary | ICD-10-CM | POA: Diagnosis not present

## 2020-01-14 DIAGNOSIS — R030 Elevated blood-pressure reading, without diagnosis of hypertension: Secondary | ICD-10-CM | POA: Diagnosis not present

## 2020-01-14 DIAGNOSIS — Z1212 Encounter for screening for malignant neoplasm of rectum: Secondary | ICD-10-CM | POA: Diagnosis not present

## 2020-01-14 DIAGNOSIS — M109 Gout, unspecified: Secondary | ICD-10-CM | POA: Diagnosis not present

## 2020-01-14 DIAGNOSIS — Z0000 Encounter for general adult medical examination without abnormal findings: Secondary | ICD-10-CM | POA: Diagnosis not present

## 2020-01-14 DIAGNOSIS — M1000 Idiopathic gout, unspecified site: Secondary | ICD-10-CM | POA: Diagnosis not present

## 2020-01-14 DIAGNOSIS — L989 Disorder of the skin and subcutaneous tissue, unspecified: Secondary | ICD-10-CM | POA: Diagnosis not present

## 2020-01-14 DIAGNOSIS — Z1331 Encounter for screening for depression: Secondary | ICD-10-CM | POA: Diagnosis not present

## 2020-01-14 DIAGNOSIS — R079 Chest pain, unspecified: Secondary | ICD-10-CM | POA: Diagnosis not present

## 2020-01-14 DIAGNOSIS — D696 Thrombocytopenia, unspecified: Secondary | ICD-10-CM | POA: Diagnosis not present

## 2020-01-14 DIAGNOSIS — B009 Herpesviral infection, unspecified: Secondary | ICD-10-CM | POA: Diagnosis not present

## 2020-01-14 DIAGNOSIS — R82998 Other abnormal findings in urine: Secondary | ICD-10-CM | POA: Diagnosis not present

## 2020-01-17 ENCOUNTER — Telehealth: Payer: Self-pay

## 2020-01-17 DIAGNOSIS — E785 Hyperlipidemia, unspecified: Secondary | ICD-10-CM

## 2020-01-17 NOTE — Telephone Encounter (Signed)
Order placed. Thanks.

## 2020-01-17 NOTE — Telephone Encounter (Signed)
-----   Message from [place], [place] sent at 01/17/2020 11:54 AM EDT ----- Regarding: Ct calcium score order Dr. Joylene Draft would like a calcium score order for the following patient  Diagnosis code=ICD-E78.5  Please order under Dr. Meda Coffee Dr. Joylene Draft will call for results    Thanks,  Anabel Halon

## 2020-01-25 ENCOUNTER — Other Ambulatory Visit: Payer: Self-pay

## 2020-01-25 ENCOUNTER — Ambulatory Visit (INDEPENDENT_AMBULATORY_CARE_PROVIDER_SITE_OTHER)
Admission: RE | Admit: 2020-01-25 | Discharge: 2020-01-25 | Disposition: A | Discharge status: Home / Self Care | Payer: Self-pay | Source: Ambulatory Visit | Attending: Cardiology | Admitting: Cardiology

## 2020-01-25 DIAGNOSIS — E785 Hyperlipidemia, unspecified: Secondary | ICD-10-CM

## 2020-06-23 DIAGNOSIS — I2510 Atherosclerotic heart disease of native coronary artery without angina pectoris: Secondary | ICD-10-CM | POA: Diagnosis not present

## 2020-06-23 DIAGNOSIS — Z1152 Encounter for screening for COVID-19: Secondary | ICD-10-CM | POA: Diagnosis not present

## 2020-06-23 DIAGNOSIS — U071 COVID-19: Secondary | ICD-10-CM | POA: Diagnosis not present

## 2020-06-23 DIAGNOSIS — R509 Fever, unspecified: Secondary | ICD-10-CM | POA: Diagnosis not present

## 2020-07-02 ENCOUNTER — Emergency Department (HOSPITAL_BASED_OUTPATIENT_CLINIC_OR_DEPARTMENT_OTHER): Payer: PPO

## 2020-07-02 ENCOUNTER — Other Ambulatory Visit: Payer: Self-pay

## 2020-07-02 ENCOUNTER — Encounter (HOSPITAL_BASED_OUTPATIENT_CLINIC_OR_DEPARTMENT_OTHER): Payer: Self-pay | Admitting: *Deleted

## 2020-07-02 ENCOUNTER — Inpatient Hospital Stay (HOSPITAL_BASED_OUTPATIENT_CLINIC_OR_DEPARTMENT_OTHER)
Admission: EM | Admit: 2020-07-02 | Discharge: 2020-07-07 | DRG: 177 | Disposition: A | Discharge status: Home / Self Care | Payer: PPO | Attending: Internal Medicine | Admitting: Internal Medicine

## 2020-07-02 DIAGNOSIS — N179 Acute kidney failure, unspecified: Secondary | ICD-10-CM

## 2020-07-02 DIAGNOSIS — Z8614 Personal history of Methicillin resistant Staphylococcus aureus infection: Secondary | ICD-10-CM

## 2020-07-02 DIAGNOSIS — N400 Enlarged prostate without lower urinary tract symptoms: Secondary | ICD-10-CM | POA: Diagnosis present

## 2020-07-02 DIAGNOSIS — T380X5A Adverse effect of glucocorticoids and synthetic analogues, initial encounter: Secondary | ICD-10-CM | POA: Diagnosis not present

## 2020-07-02 DIAGNOSIS — J1282 Pneumonia due to coronavirus disease 2019: Secondary | ICD-10-CM | POA: Diagnosis not present

## 2020-07-02 DIAGNOSIS — J9601 Acute respiratory failure with hypoxia: Secondary | ICD-10-CM

## 2020-07-02 DIAGNOSIS — Z87891 Personal history of nicotine dependence: Secondary | ICD-10-CM

## 2020-07-02 DIAGNOSIS — U071 COVID-19: Secondary | ICD-10-CM | POA: Diagnosis not present

## 2020-07-02 DIAGNOSIS — Z79899 Other long term (current) drug therapy: Secondary | ICD-10-CM

## 2020-07-02 DIAGNOSIS — R0902 Hypoxemia: Secondary | ICD-10-CM | POA: Diagnosis not present

## 2020-07-02 DIAGNOSIS — D72829 Elevated white blood cell count, unspecified: Secondary | ICD-10-CM | POA: Diagnosis present

## 2020-07-02 DIAGNOSIS — R0602 Shortness of breath: Secondary | ICD-10-CM | POA: Diagnosis not present

## 2020-07-02 LAB — FERRITIN: Ferritin: 1298 ng/mL — ABNORMAL HIGH (ref 24–336)

## 2020-07-02 LAB — TRIGLYCERIDES: Triglycerides: 98 mg/dL (ref <150)

## 2020-07-02 LAB — CBC WITH DIFFERENTIAL/PLATELET
Abs Immature Granulocytes: 1.57 10*3/uL — ABNORMAL HIGH (ref 0.00–0.07)
Basophils Absolute: 0.1 10*3/uL (ref 0.0–0.1)
Basophils Relative: 0 %
Eosinophils Absolute: 0 10*3/uL (ref 0.0–0.5)
Eosinophils Relative: 0 %
HCT: 44.7 % (ref 39.0–52.0)
Hemoglobin: 15.2 g/dL (ref 13.0–17.0)
Immature Granulocytes: 9 %
Lymphocytes Relative: 9 %
Lymphs Abs: 1.6 10*3/uL (ref 0.7–4.0)
MCH: 32.3 pg (ref 26.0–34.0)
MCHC: 34 g/dL (ref 30.0–36.0)
MCV: 94.9 fL (ref 80.0–100.0)
Monocytes Absolute: 1.6 10*3/uL — ABNORMAL HIGH (ref 0.1–1.0)
Monocytes Relative: 9 %
Neutro Abs: 12.4 10*3/uL — ABNORMAL HIGH (ref 1.7–7.7)
Neutrophils Relative %: 73 %
Platelets: 323 10*3/uL (ref 150–400)
RBC: 4.71 MIL/uL (ref 4.22–5.81)
RDW: 12.3 % (ref 11.5–15.5)
Smear Review: NORMAL
WBC: 17.3 10*3/uL — ABNORMAL HIGH (ref 4.0–10.5)
nRBC: 0 % (ref 0.0–0.2)

## 2020-07-02 LAB — RESPIRATORY PANEL BY RT PCR (FLU A&B, COVID)
Influenza A by PCR: NEGATIVE
Influenza B by PCR: NEGATIVE
SARS Coronavirus 2 by RT PCR: POSITIVE — AB

## 2020-07-02 LAB — COMPREHENSIVE METABOLIC PANEL
ALT: 77 U/L — ABNORMAL HIGH (ref 0–44)
AST: 54 U/L — ABNORMAL HIGH (ref 15–41)
Albumin: 3.3 g/dL — ABNORMAL LOW (ref 3.5–5.0)
Alkaline Phosphatase: 62 U/L (ref 38–126)
Anion gap: 13 (ref 5–15)
BUN: 19 mg/dL (ref 8–23)
CO2: 25 mmol/L (ref 22–32)
Calcium: 9 mg/dL (ref 8.9–10.3)
Chloride: 100 mmol/L (ref 98–111)
Creatinine, Ser: 1.39 mg/dL — ABNORMAL HIGH (ref 0.61–1.24)
GFR, Estimated: 55 mL/min — ABNORMAL LOW (ref >60)
Glucose, Bld: 110 mg/dL — ABNORMAL HIGH (ref 70–99)
Potassium: 4.2 mmol/L (ref 3.5–5.1)
Sodium: 138 mmol/L (ref 135–145)
Total Bilirubin: 1.6 mg/dL — ABNORMAL HIGH (ref 0.3–1.2)
Total Protein: 7.5 g/dL (ref 6.5–8.1)

## 2020-07-02 LAB — LACTATE DEHYDROGENASE: LDH: 326 U/L — ABNORMAL HIGH (ref 98–192)

## 2020-07-02 LAB — LACTIC ACID, PLASMA: Lactic Acid, Venous: 1.4 mmol/L (ref 0.5–1.9)

## 2020-07-02 LAB — FIBRINOGEN: Fibrinogen: >800 mg/dL — ABNORMAL HIGH (ref 210–475)

## 2020-07-02 LAB — C-REACTIVE PROTEIN: CRP: 14.8 mg/dL — ABNORMAL HIGH (ref <1.0)

## 2020-07-02 LAB — D-DIMER, QUANTITATIVE: D-Dimer, Quant: 1.72 ug/mL-FEU — ABNORMAL HIGH (ref 0.00–0.50)

## 2020-07-02 MED ORDER — IOHEXOL 350 MG/ML SOLN
75.0000 mL | Freq: Once | INTRAVENOUS | Status: AC | PRN
  Administered 2020-07-02: 75 mL via INTRAVENOUS

## 2020-07-02 MED ORDER — IOHEXOL 300 MG/ML  SOLN: 100.0000 mL | Freq: Once | INTRAMUSCULAR | Status: DC | PRN

## 2020-07-02 NOTE — ED Provider Notes (Signed)
[place] EMERGENCY DEPARTMENT Provider Note   CSN: [id number] Arrival date & time: 07/02/20  1704     History Chief Complaint  Patient presents with   Pneumonia    covid +    John Houston is a 69 y.o. male.  69 year old male with history of gout who presents with cough and shortness of breath.  Patient began getting sick with cough and fevers 2 weeks ago.  He tested positive for COVID-19.  He reports that his fevers have resolved but he has continued to have a cough and over the past several days has been feeling progressively more short of breath especially with exertion.  He reports some mild chest soreness only when coughing.  He denies any vomiting or diarrhea.  No leg swelling.  No history of blood clots or history of lung problems.  No medications prior to arrival.  He is unvaccinated against COVID-19.  The history is provided by the patient.  Pneumonia       History reviewed. No pertinent past medical history.  Patient Active Problem List   Diagnosis Date Noted   Pneumonia due to COVID-19 virus 07/02/2020   MRSA bacteremia 11/01/2019   Bacteremia 11/01/2019    Past Surgical History:  Procedure Laterality Date   CARPAL TUNNEL RELEASE  2001 and 2005   bilateral   TEE WITHOUT CARDIOVERSION N/A 11/05/2019   Procedure: TRANSESOPHAGEAL ECHOCARDIOGRAM (TEE);  Surgeon: Jerline Pain, MD;  Location: [facility] ENDOSCOPY;  Service: Cardiovascular;  Laterality: N/A;       No family history on file.  Social History   Tobacco Use   Smoking status: Former Smoker   Smokeless tobacco: Never Used  Substance Use Topics   Alcohol use: Yes    Alcohol/week: 18.0 standard drinks    Types: 18 Cans of beer per week   Drug use: No    Home Medications Prior to Admission medications   Medication Sig Start Date End Date Taking? Authorizing Provider  acetaminophen (TYLENOL) 325 MG tablet Take 2 tablets (650 mg total) by mouth every 6 (six) hours as needed  for mild pain (or Fever >/= 101). 11/05/19   Nita Sells, MD  colchicine 0.6 MG tablet Take 0.5 tablets (0.3 mg total) by mouth 2 (two) times daily. 11/05/19   Nita Sells, MD  HYDROcodone-acetaminophen (NORCO/VICODIN) 5-325 MG tablet Take 1-2 tablets by mouth every 6 (six) hours as needed for moderate pain. 10/30/19   Fredia Sorrow, MD  Multiple Vitamins-Minerals (MULTIVITAMIN WITH MINERALS) tablet Take 1 tablet by mouth daily.    [provider]  senna-docusate (SENOKOT-S) 8.6-50 MG tablet Take 1 tablet by mouth at bedtime as needed for mild constipation. 11/05/19   Nita Sells, MD  tamsulosin (FLOMAX) 0.4 MG CAPS capsule Take 1 capsule (0.4 mg total) by mouth daily. 11/06/19   Nita Sells, MD    Allergies    Patient has no known allergies.  Review of Systems   Review of Systems  Physical Exam Updated Vital Signs BP 139/79    Pulse 71    Temp 99.7 F (37.6 C)    Resp (!) 21    Ht 5\' 9"  (1.753 m)    Wt 79.4 kg    SpO2 96%    BMI 25.84 kg/m   Physical Exam Vitals and nursing note reviewed.  Constitutional:      General: He is not in acute distress.    Appearance: He is well-developed.  HENT:     Head: Normocephalic and  atraumatic.  Eyes:     Conjunctiva/sclera: Conjunctivae normal.  Cardiovascular:     Rate and Rhythm: Normal rate and regular rhythm.     Heart sounds: Normal heart sounds. No murmur heard.   Pulmonary:     Effort: Pulmonary effort is normal.     Comments: Diminished BS b/l Abdominal:     General: Bowel sounds are normal. There is no distension.     Palpations: Abdomen is soft.     Tenderness: There is no abdominal tenderness.  Musculoskeletal:     Cervical back: Neck supple.     Right lower leg: No edema.     Left lower leg: No edema.  Skin:    General: Skin is warm and dry.  Neurological:     Mental Status: He is alert and oriented to person, place, and time.     Comments: Fluent speech  Psychiatric:        Mood  and Affect: Mood normal.        Judgment: Judgment normal.     ED Results / Procedures / Treatments   Labs (all labs ordered are listed, but only abnormal results are displayed) Labs Reviewed  RESPIRATORY PANEL BY RT PCR (FLU A&B, COVID) - Abnormal; Notable for the following components:      Result Value   SARS Coronavirus 2 by RT PCR POSITIVE (*)    All other components within normal limits  CBC WITH DIFFERENTIAL/PLATELET - Abnormal; Notable for the following components:   WBC 17.3 (*)    Neutro Abs 12.4 (*)    Monocytes Absolute 1.6 (*)    Abs Immature Granulocytes 1.57 (*)    All other components within normal limits  COMPREHENSIVE METABOLIC PANEL - Abnormal; Notable for the following components:   Glucose, Bld 110 (*)    Creatinine, Ser 1.39 (*)    Albumin 3.3 (*)    AST 54 (*)    ALT 77 (*)    Total Bilirubin 1.6 (*)    GFR, Estimated 55 (*)    All other components within normal limits  D-DIMER, QUANTITATIVE (NOT AT [facility]) - Abnormal; Notable for the following components:   D-Dimer, Quant 1.72 (*)    All other components within normal limits  CULTURE, BLOOD (ROUTINE X 2)  CULTURE, BLOOD (ROUTINE X 2)  LACTIC ACID, PLASMA  PROCALCITONIN  LACTATE DEHYDROGENASE  FERRITIN  TRIGLYCERIDES  FIBRINOGEN  C-REACTIVE PROTEIN    EKG EKG Interpretation  Date/Time:  Wednesday July 02 2020 17:24:07 EDT Ventricular Rate:  76 PR Interval:    QRS Duration: 84 QT Interval:  383 QTC Calculation: 431 R Axis:   23 Text Interpretation: Sinus rhythm No significant change since last tracing Confirmed by Theotis Burrow [phone]) on 07/02/2020 6:12:05 PM   Radiology DG Chest Port 1 View  Result Date: 07/02/2020 CLINICAL DATA:  COVID, hypoxia EXAM: PORTABLE CHEST 1 VIEW COMPARISON:  11/01/2019 chest radiograph. FINDINGS: Stable cardiomediastinal silhouette with normal heart size. No pneumothorax. No pleural effusion. Extensive patchy opacities throughout the peripheral lungs  bilaterally. IMPRESSION: Extensive patchy opacities throughout the peripheral lungs bilaterally, compatible with COVID-19 pneumonia. Electronically Signed   By: Ilona Sorrel M.D.   On: 07/02/2020 18:28    Procedures Procedures (including critical care time)  Medications Ordered in ED Medications - No data to display  ED Course  I have reviewed the triage vital signs and the nursing notes.  Pertinent labs & imaging results that were available during my care of the patient were  reviewed by me and considered in my medical decision making (see chart for details).    MDM Rules/Calculators/A&P                          Pt alert, no resp distress on exam but 78% on RA and placed on HFNC 8L. He is unfortunately outside the window for MAB infusion to be effective.  Lab work notable for WBC 17.3 although it appears he has had leukocytosis multiple times previously; creatinine 1.39; AST 54 ALT 77 total bili 1.6.  Chest x-ray with multifocal patchy opacities consistent with COVID-19.  Discussed admission with [facility], Dr. Tonie Griffith, who has requested CTA chest to r/o PE; I have ordered this study and it is pending. Pt will be admitted at [facility] for further treatment.   John Houston was evaluated in Emergency Department on 07/02/2020 for the symptoms described in the history of present illness. He was evaluated in the context of the global COVID-19 pandemic, which necessitated consideration that the patient might be at risk for infection with the SARS-CoV-2 virus that causes COVID-19. Institutional protocols and algorithms that pertain to the evaluation of patients at risk for COVID-19 are in a state of rapid change based on information released by regulatory bodies including the CDC and federal and state organizations. These policies and algorithms were followed during the patient's care in the ED.  Final Clinical Impression(s) / ED Diagnoses Final diagnoses:  Acute hypoxemic respiratory  failure due to COVID-19 [facility])  AKI (acute kidney injury) [facility])    Rx / DC Orders ED Discharge Orders    None       Sarahelizabeth Conway, Wenda Overland, MD 07/02/20 1931

## 2020-07-02 NOTE — ED Notes (Signed)
Patient transported to CT 

## 2020-07-02 NOTE — Plan of Care (Signed)
  Problem: Clinical Measurements: Goal: Diagnostic test results will improve Outcome: Progressing   

## 2020-07-02 NOTE — ED Triage Notes (Signed)
Today dx Pneumonia , covid + , SOB x 2 days

## 2020-07-03 ENCOUNTER — Encounter (HOSPITAL_COMMUNITY): Payer: Self-pay | Admitting: Family Medicine

## 2020-07-03 DIAGNOSIS — D72829 Elevated white blood cell count, unspecified: Secondary | ICD-10-CM

## 2020-07-03 DIAGNOSIS — N179 Acute kidney failure, unspecified: Secondary | ICD-10-CM

## 2020-07-03 DIAGNOSIS — U071 COVID-19: Principal | ICD-10-CM

## 2020-07-03 DIAGNOSIS — J1282 Pneumonia due to coronavirus disease 2019: Secondary | ICD-10-CM

## 2020-07-03 DIAGNOSIS — J9601 Acute respiratory failure with hypoxia: Secondary | ICD-10-CM | POA: Diagnosis present

## 2020-07-03 LAB — CBC WITH DIFFERENTIAL/PLATELET
Abs Immature Granulocytes: 1.37 10*3/uL — ABNORMAL HIGH (ref 0.00–0.07)
Basophils Absolute: 0 10*3/uL (ref 0.0–0.1)
Basophils Relative: 0 %
Eosinophils Absolute: 0 10*3/uL (ref 0.0–0.5)
Eosinophils Relative: 0 %
HCT: 43.3 % (ref 39.0–52.0)
Hemoglobin: 14.3 g/dL (ref 13.0–17.0)
Immature Granulocytes: 9 %
Lymphocytes Relative: 11 %
Lymphs Abs: 1.6 10*3/uL (ref 0.7–4.0)
MCH: 32.6 pg (ref 26.0–34.0)
MCHC: 33 g/dL (ref 30.0–36.0)
MCV: 98.6 fL (ref 80.0–100.0)
Monocytes Absolute: 1.5 10*3/uL — ABNORMAL HIGH (ref 0.1–1.0)
Monocytes Relative: 10 %
Neutro Abs: 10.8 10*3/uL — ABNORMAL HIGH (ref 1.7–7.7)
Neutrophils Relative %: 70 %
Platelets: 309 10*3/uL (ref 150–400)
RBC: 4.39 MIL/uL (ref 4.22–5.81)
RDW: 12.3 % (ref 11.5–15.5)
WBC: 15.4 10*3/uL — ABNORMAL HIGH (ref 4.0–10.5)
nRBC: 0 % (ref 0.0–0.2)

## 2020-07-03 LAB — COMPREHENSIVE METABOLIC PANEL
ALT: 73 U/L — ABNORMAL HIGH (ref 0–44)
AST: 52 U/L — ABNORMAL HIGH (ref 15–41)
Albumin: 3.1 g/dL — ABNORMAL LOW (ref 3.5–5.0)
Alkaline Phosphatase: 61 U/L (ref 38–126)
Anion gap: 10 (ref 5–15)
BUN: 19 mg/dL (ref 8–23)
CO2: 25 mmol/L (ref 22–32)
Calcium: 8.7 mg/dL — ABNORMAL LOW (ref 8.9–10.3)
Chloride: 101 mmol/L (ref 98–111)
Creatinine, Ser: 1.23 mg/dL (ref 0.61–1.24)
GFR, Estimated: >60 mL/min (ref >60)
Glucose, Bld: 112 mg/dL — ABNORMAL HIGH (ref 70–99)
Potassium: 4.9 mmol/L (ref 3.5–5.1)
Sodium: 136 mmol/L (ref 135–145)
Total Bilirubin: 1.8 mg/dL — ABNORMAL HIGH (ref 0.3–1.2)
Total Protein: 7 g/dL (ref 6.5–8.1)

## 2020-07-03 LAB — PROCALCITONIN
Procalcitonin: <0.1 ng/mL
Procalcitonin: <0.1 ng/mL

## 2020-07-03 LAB — D-DIMER, QUANTITATIVE: D-Dimer, Quant: 1.55 ug/mL-FEU — ABNORMAL HIGH (ref 0.00–0.50)

## 2020-07-03 LAB — C-REACTIVE PROTEIN: CRP: 13.6 mg/dL — ABNORMAL HIGH (ref <1.0)

## 2020-07-03 MED ORDER — ONDANSETRON HCL 4 MG/2ML IJ SOLN: 4.0000 mg | Freq: Four times a day (QID) | INTRAMUSCULAR | Status: DC | PRN

## 2020-07-03 MED ORDER — SODIUM CHLORIDE 0.9 % IV SOLN
100.0000 mg | Freq: Every day | INTRAVENOUS | Status: AC
  Administered 2020-07-04 – 2020-07-07 (×4): 100 mg via INTRAVENOUS
  Filled 2020-07-03 (×4): qty 20

## 2020-07-03 MED ORDER — PREDNISONE 20 MG PO TABS: 50.0000 mg | ORAL_TABLET | Freq: Every day | ORAL | Status: DC

## 2020-07-03 MED ORDER — METHYLPREDNISOLONE SODIUM SUCC 125 MG IJ SOLR
80.0000 mg | Freq: Two times a day (BID) | INTRAMUSCULAR | Status: DC
  Administered 2020-07-03: 80 mg via INTRAVENOUS
  Filled 2020-07-03: qty 2

## 2020-07-03 MED ORDER — ONDANSETRON HCL 4 MG PO TABS: 4.0000 mg | ORAL_TABLET | Freq: Four times a day (QID) | ORAL | Status: DC | PRN

## 2020-07-03 MED ORDER — ENOXAPARIN SODIUM 40 MG/0.4ML ~~LOC~~ SOLN
40.0000 mg | SUBCUTANEOUS | Status: DC
  Administered 2020-07-03 – 2020-07-07 (×5): 40 mg via SUBCUTANEOUS
  Filled 2020-07-03 (×5): qty 0.4

## 2020-07-03 MED ORDER — BARICITINIB 2 MG PO TABS
4.0000 mg | ORAL_TABLET | Freq: Every day | ORAL | Status: DC
  Administered 2020-07-03 – 2020-07-07 (×5): 4 mg via ORAL
  Filled 2020-07-03 (×5): qty 2

## 2020-07-03 MED ORDER — SODIUM CHLORIDE 0.9 % IV SOLN
200.0000 mg | Freq: Once | INTRAVENOUS | Status: AC
  Administered 2020-07-03: 200 mg via INTRAVENOUS
  Filled 2020-07-03: qty 200

## 2020-07-03 MED ORDER — ACETAMINOPHEN 325 MG PO TABS: 650.0000 mg | ORAL_TABLET | Freq: Four times a day (QID) | ORAL | Status: DC | PRN

## 2020-07-03 MED ORDER — METHYLPREDNISOLONE SODIUM SUCC 125 MG IJ SOLR
80.0000 mg | Freq: Two times a day (BID) | INTRAMUSCULAR | Status: DC
  Administered 2020-07-03 – 2020-07-07 (×9): 80 mg via INTRAVENOUS
  Filled 2020-07-03 (×10): qty 2

## 2020-07-03 NOTE — Progress Notes (Signed)
PROGRESS NOTE  Brief Narrative: John Houston is a 69 y.o. male with a history of prior hospitalization for MRSA and recent covid-19 diagnosis who presented to the ED 10/27 with continued progressive dyspnea for 1-2 weeks. On arrival SpO2 78% on room air, placed on 8L HFNC. CXR revealed multifocal patchy opacities confirmed on CTA chest which showed no PE. Remdesivir and steroids started, and the patient was admitted to [facility] this morning by Dr. Alcario Drought for acute hypoxic respiratory failure due to covid-19 pneumonia.  Subjective: Feels his breathing is about the same from admission, no fever. Hasn't gotten up, that's when dyspnea is worst. No chest pain or bleeding or leg swelling.   Objective: BP 117/75 (BP Location: Left Arm)    Pulse 77    Temp 98.2 F (36.8 C) (Oral)    Resp 18    Ht 5\' 9"  (1.753 m)    Wt 79.4 kg    SpO2 96%    BMI 25.84 kg/m   Gen: Nontoxic Pulm: Nonlabored at rest, rate 18/min, crackles at mid-lower lung zones bilaterally. No wheezing.  CV: RRR, no murmur, no JVD, no edema GI: Soft, NT, ND, +BS  Neuro: Alert and oriented. No focal deficits. Skin: No rashes, lesions or ulcers  Assessment & Plan: Principal Problem:   Pneumonia due to COVID-19 virus Active Problems:   Leukocytosis   Acute respiratory failure with hypoxia (HCC)  Acute hypoxemic respiratory failure due to covid-19 pneumonia: SARS-CoV-2 PCR confirmed positive on 10/27, reportedly positive testing prior to that.  - Continue remdesivir x5 days (10/28 - 11/1) as potential benefit is thought to outweigh risk. - Continue solumedrol 2mg /kg/day div. q12h - Encourage OOB, IS, FV, and awake proning if able - Continue airborne, contact precautions for 21 days from positive testing. - Monitor CMP and inflammatory markers - Enoxaparin prophylactic dose.  - The patient is on high and escalating supplemental oxygen with dense airspace opacities with peripheral and basilar predominance on CTA chest by my personal  review. Inflammatory marker elevation in addition to his age also puts him at high risk of ICU transfer. Based on ACTT-2 and COV-BARRIER trials and other available data, baricitinib is being used under EUA by the FDA. The patient has no ESRD, known history of TB, severe neutropenia (ANC <500) or lymphopenia (ALC <200), or severe LFT elevations. They are not on DMARDs. The option to use/refuse baricitinib treatment under FDA authorization (not approval), the significant known and potential risks and benefits, the extent to which these are unknown, and information regarding all available alternatives were discussed in detail. Specifically the risk of VTE and secondary infections were discussed in detail with the patient who consents to proceed with treatment. Will start at 2mg  dosing with CrCl just below 51ml/min, though dose may need to be increased based on renal function going forward. - The patient did not want me to call family today.   Leukocytosis: This may be a more chronic finding for the patient. Procalcitonin is durably undetectable and no bacterial nidus of infection is currently noted.  - Continue monitoring blood cultures  LFT elevation: Likely due to covid. Not a contraindication to therapies at this point.  - Continue monitoring.   Patrecia Pour, MD Pager on amion 07/03/2020, 4:55 PM

## 2020-07-03 NOTE — H&P (Signed)
History and Physical    John Houston DQQ:[id number] DOB: April 20, 1951 DOA: 07/02/2020  PCP: Crist Infante, MD  Patient coming from: Home  I have personally briefly reviewed patient's old medical records in [place]  Chief Complaint: COVID  HPI: John Houston is a 69 y.o. male who presents to the ED with c/o cough, SOB.  Pt did not get COVID vaccine.  Started feeling ill with cough, SOB, fevers, onset 2 weeks ago.  Tested positive for COVID.  Fevers resolved but cough and SOB have progressively worsened over past few days, especially worse with exertion.  No leg swelling, no h/o blood clots nor lung problems.  No CP, no vomiting, no diarrhea.   ED Course: COVID positive.  CTA neg for PE, shows multifocal PNA c/w viral pneumonia.  WBC 17k with left shift.   Review of Systems: As per HPI, otherwise all review of systems negative.  History reviewed. No pertinent past medical history.  Past Surgical History:  Procedure Laterality Date  . CARPAL TUNNEL RELEASE  2001 and 2005   bilateral  . TEE WITHOUT CARDIOVERSION N/A 11/05/2019   Procedure: TRANSESOPHAGEAL ECHOCARDIOGRAM (TEE);  Surgeon: Jerline Pain, MD;  Location: [facility] ENDOSCOPY;  Service: Cardiovascular;  Laterality: N/A;     reports that he has quit smoking. He has never used smokeless tobacco. He reports current alcohol use of about 18.0 standard drinks of alcohol per week. He reports that he does not use drugs.  No Known Allergies  Family History  Problem Relation Age of Onset  . Arthritis Mother   . Cancer Brother      Prior to Admission medications   Medication Sig Start Date End Date Taking? Authorizing Provider  acetaminophen (TYLENOL) 325 MG tablet Take 2 tablets (650 mg total) by mouth every 6 (six) hours as needed for mild pain (or Fever >/= 101). 11/05/19  Yes Nita Sells, MD  Multiple Vitamins-Minerals (MULTIVITAMIN WITH MINERALS) tablet Take 1 tablet by mouth daily.   Yes [provider]  HYDROcodone-acetaminophen (NORCO/VICODIN) 5-325 MG tablet Take 1-2 tablets by mouth every 6 (six) hours as needed for moderate pain. Patient not taking: Reported on 07/02/2020 10/30/19   Fredia Sorrow, MD  senna-docusate (SENOKOT-S) 8.6-50 MG tablet Take 1 tablet by mouth at bedtime as needed for mild constipation. Patient not taking: Reported on 07/02/2020 11/05/19   Nita Sells, MD  tamsulosin (FLOMAX) 0.4 MG CAPS capsule Take 1 capsule (0.4 mg total) by mouth daily. Patient not taking: Reported on 07/02/2020 11/06/19   Nita Sells, MD    Physical Exam: Vitals:   07/02/20 1900 07/02/20 2138 07/02/20 2210 07/02/20 2255  BP: 139/79 (!) 132/92 109/81 132/81  Pulse: 71 78 80 78  Resp: (!) 21 19 19  (!) 24  Temp:  99.2 F (37.3 C) 99.1 F (37.3 C) 98.3 F (36.8 C)  TempSrc:    Oral  SpO2: 96% 100% 100% 94%  Weight:      Height:        Constitutional: NAD, calm, comfortable Eyes: PERRL, lids and conjunctivae normal ENMT: Mucous membranes are moist. Posterior pharynx clear of any exudate or lesions.Normal dentition.  Neck: normal, supple, no masses, no thyromegaly Respiratory: clear to auscultation bilaterally, no wheezing, no crackles. Normal respiratory effort. No accessory muscle use.  Cardiovascular: Regular rate and rhythm, no murmurs / rubs / gallops. No extremity edema. 2+ pedal pulses. No carotid bruits.  Abdomen: no tenderness, no masses palpated. No hepatosplenomegaly. Bowel sounds positive.  Musculoskeletal:  no clubbing / cyanosis. No joint deformity upper and lower extremities. Good ROM, no contractures. Normal muscle tone.  Skin: no rashes, lesions, ulcers. No induration Neurologic: CN 2-12 grossly intact. Sensation intact, DTR normal. Strength 5/5 in all 4.  Psychiatric: Normal judgment and insight. Alert and oriented x 3. Normal mood.    Labs on Admission: I have personally reviewed following labs and imaging studies  CBC: Recent Labs    Lab 07/07/20 1736  WBC 17.3*  NEUTROABS 12.4*  HGB 15.2  HCT 44.7  MCV 94.9  PLT 809   Basic Metabolic Panel: Recent Labs  Lab Jul 07, 2020 1736  NA 138  K 4.2  CL 100  CO2 25  GLUCOSE 110*  BUN 19  CREATININE 1.39*  CALCIUM 9.0   GFR: Estimated Creatinine Clearance: 50.9 mL/min (A) (by C-G formula based on SCr of 1.39 mg/dL (H)). Liver Function Tests: Recent Labs  Lab 07-Jul-2020 1736  AST 54*  ALT 77*  ALKPHOS 62  BILITOT 1.6*  PROT 7.5  ALBUMIN 3.3*   No results for input(s): LIPASE, AMYLASE in the last 168 hours. No results for input(s): AMMONIA in the last 168 hours. Coagulation Profile: No results for input(s): INR, PROTIME in the last 168 hours. Cardiac Enzymes: No results for input(s): CKTOTAL, CKMB, CKMBINDEX, TROPONINI in the last 168 hours. BNP (last 3 results) No results for input(s): PROBNP in the last 8760 hours. HbA1C: No results for input(s): HGBA1C in the last 72 hours. CBG: No results for input(s): GLUCAP in the last 168 hours. Lipid Profile: Recent Labs    07-07-2020 1736  TRIG 98   Thyroid Function Tests: No results for input(s): TSH, T4TOTAL, FREET4, T3FREE, THYROIDAB in the last 72 hours. Anemia Panel: Recent Labs    2020-07-07 1736  FERRITIN 1,298*   Urine analysis:    Component Value Date/Time   COLORURINE YELLOW 11/01/2019 [place] 11/01/2019 0955   LABSPEC <1.005 (L) 11/01/2019 0955   PHURINE 6.0 11/01/2019 0955   GLUCOSEU NEGATIVE 11/01/2019 0955   HGBUR TRACE (A) 11/01/2019 0955   BILIRUBINUR NEGATIVE 11/01/2019 0955   KETONESUR NEGATIVE 11/01/2019 0955   PROTEINUR NEGATIVE 11/01/2019 0955   NITRITE POSITIVE (A) 11/01/2019 0955   LEUKOCYTESUR SMALL (A) 11/01/2019 0955    Radiological Exams on Admission: CT Angio Chest PE W/Cm &/Or Wo Cm  Result Date: 07/07/20 CLINICAL DATA:  COVID positive shortness of breath EXAM: CT ANGIOGRAPHY CHEST WITH CONTRAST TECHNIQUE: Multidetector CT imaging of the chest  was performed using the standard protocol during bolus administration of intravenous contrast. Multiplanar CT image reconstructions and MIPs were obtained to evaluate the vascular anatomy. CONTRAST:  70mL OMNIPAQUE IOHEXOL 350 MG/ML SOLN COMPARISON:  None. FINDINGS: Cardiovascular: There is a optimal opacification of the pulmonary arteries. There is no central,segmental, or subsegmental filling defects within the pulmonary arteries. The heart is normal in size. No pericardial effusion or thickening. No evidence right heart strain. There is normal three-vessel brachiocephalic anatomy without proximal stenosis. The thoracic aorta is normal in appearance. Mediastinum/Nodes: No hilar, mediastinal, or axillary adenopathy. Thyroid gland, trachea, and esophagus demonstrate no significant findings. Lungs/Pleura: Multifocal patchy ground-glass opacities are seen throughout both lungs, predominantly throughout both lung bases. No pleural effusion or pneumothorax. Upper Abdomen: No acute abnormalities present in the visualized portions of the upper abdomen. Musculoskeletal: No chest wall abnormality. No acute or significant osseous findings. Review of the MIP images confirms the above findings. IMPRESSION: No central, segmental, or subsegmental pulmonary embolism. Extensive multifocal airspace  opacities, consistent with atypical viral pneumonia Electronically Signed   By: Prudencio Pair M.D.   On: 07/02/2020 20:29   DG Chest Port 1 View  Result Date: 07/02/2020 CLINICAL DATA:  COVID, hypoxia EXAM: PORTABLE CHEST 1 VIEW COMPARISON:  11/01/2019 chest radiograph. FINDINGS: Stable cardiomediastinal silhouette with normal heart size. No pneumothorax. No pleural effusion. Extensive patchy opacities throughout the peripheral lungs bilaterally. IMPRESSION: Extensive patchy opacities throughout the peripheral lungs bilaterally, compatible with COVID-19 pneumonia. Electronically Signed   By: Ilona Sorrel M.D.   On: 07/02/2020 18:28      EKG: Independently reviewed.  Assessment/Plan Principal Problem:   Pneumonia due to COVID-19 virus Active Problems:   Leukocytosis   Acute respiratory failure with hypoxia (HCC)    1. COVID-19 PNA with new O2 requirement - 1. COVID pathway 2. Needing 8L via [place] currently 3. Remdesivir 4. Steroids 5. Procalcitonin pending 1. need to decide on barcitinib vs antibiotics.  Leaning twords the latter given the 17k WBC and left shift. 6. Daily labs 7. Cont pulse ox 8. CTA neg for PE 2. ? Superimposed CAP? 1. 17k WBC with left shift 2. procalcitonin pending 3. If procalcitonin elevated, then would start ABx  DVT prophylaxis: Lovenox Code Status: Full Family Communication: No family in room Disposition Plan: Home after O2 requirement improved Consults called: None Admission status: Admit to inpatient  Severity of Illness: The appropriate patient status for this patient is INPATIENT. Inpatient status is judged to be reasonable and necessary in order to provide the required intensity of service to ensure the patient's safety. The patient's presenting symptoms, physical exam findings, and initial radiographic and laboratory data in the context of their chronic comorbidities is felt to place them at high risk for further clinical deterioration. Furthermore, it is not anticipated that the patient will be medically stable for discharge from the hospital within 2 midnights of admission. The following factors support the patient status of inpatient.   IP status due to new O2 requirement associated with COVID-19.  * I certify that at the point of admission it is my clinical judgment that the patient will require inpatient hospital care spanning beyond 2 midnights from the point of admission due to high intensity of service, high risk for further deterioration and high frequency of surveillance required.*    Jawan Chavarria M. DO [facility]  How to contact the [facility] Attending or  Consulting provider [place] or covering provider during after hours [place], for this patient?  1. Check the care team in [facility] and look for a) attending/consulting [facility] provider listed and b) the [facility] team listed 2. Log into [email or web address]  Amion Physician Scheduling and messaging for groups and whole hospitals  On call and physician scheduling software for group practices, residents, hospitalists and other medical providers for call, clinic, rotation and shift schedules. On[employer] is a hospital-wide system for scheduling doctors and paging doctors on call. EasyPlot is for scientific plotting and data analysis.  [email or web address]  and use [place]'s universal password to access. If you do not have the password, please contact the hospital operator.  3. Locate the [facility] provider you are looking for under [facility] and page to a number that you can be directly reached. 4. If you still have difficulty reaching the provider, please page the [facility] (Director on Call) for the Hospitalists listed on amion for assistance.  07/03/2020, 12:31 AM

## 2020-07-04 LAB — CBC WITH DIFFERENTIAL/PLATELET
Abs Immature Granulocytes: 2.04 10*3/uL — ABNORMAL HIGH (ref 0.00–0.07)
Basophils Absolute: 0.1 10*3/uL (ref 0.0–0.1)
Basophils Relative: 1 %
Eosinophils Absolute: 0 10*3/uL (ref 0.0–0.5)
Eosinophils Relative: 0 %
HCT: 43.5 % (ref 39.0–52.0)
Hemoglobin: 14.3 g/dL (ref 13.0–17.0)
Immature Granulocytes: 8 %
Lymphocytes Relative: 8 %
Lymphs Abs: 2 10*3/uL (ref 0.7–4.0)
MCH: 32.5 pg (ref 26.0–34.0)
MCHC: 32.9 g/dL (ref 30.0–36.0)
MCV: 98.9 fL (ref 80.0–100.0)
Monocytes Absolute: 0.9 10*3/uL (ref 0.1–1.0)
Monocytes Relative: 4 %
Neutro Abs: 19.3 10*3/uL — ABNORMAL HIGH (ref 1.7–7.7)
Neutrophils Relative %: 79 %
Platelets: 311 10*3/uL (ref 150–400)
RBC: 4.4 MIL/uL (ref 4.22–5.81)
RDW: 12.2 % (ref 11.5–15.5)
WBC: 24.3 10*3/uL — ABNORMAL HIGH (ref 4.0–10.5)
nRBC: 0 % (ref 0.0–0.2)

## 2020-07-04 LAB — C-REACTIVE PROTEIN: CRP: 11.5 mg/dL — ABNORMAL HIGH (ref <1.0)

## 2020-07-04 LAB — COMPREHENSIVE METABOLIC PANEL
ALT: 84 U/L — ABNORMAL HIGH (ref 0–44)
AST: 49 U/L — ABNORMAL HIGH (ref 15–41)
Albumin: 3 g/dL — ABNORMAL LOW (ref 3.5–5.0)
Alkaline Phosphatase: 57 U/L (ref 38–126)
Anion gap: 11 (ref 5–15)
BUN: 19 mg/dL (ref 8–23)
CO2: 19 mmol/L — ABNORMAL LOW (ref 22–32)
Calcium: 8.9 mg/dL (ref 8.9–10.3)
Chloride: 106 mmol/L (ref 98–111)
Creatinine, Ser: 0.93 mg/dL (ref 0.61–1.24)
GFR, Estimated: >60 mL/min (ref >60)
Glucose, Bld: 160 mg/dL — ABNORMAL HIGH (ref 70–99)
Potassium: 4.4 mmol/L (ref 3.5–5.1)
Sodium: 136 mmol/L (ref 135–145)
Total Bilirubin: 1.1 mg/dL (ref 0.3–1.2)
Total Protein: 7.1 g/dL (ref 6.5–8.1)

## 2020-07-04 LAB — D-DIMER, QUANTITATIVE: D-Dimer, Quant: 1.14 ug/mL-FEU — ABNORMAL HIGH (ref 0.00–0.50)

## 2020-07-04 MED ORDER — HYDROCOD POLST-CPM POLST ER 10-8 MG/5ML PO SUER
5.0000 mL | Freq: Two times a day (BID) | ORAL | Status: DC
  Administered 2020-07-04 – 2020-07-07 (×6): 5 mL via ORAL
  Filled 2020-07-04 (×6): qty 5

## 2020-07-04 MED ORDER — ALBUTEROL SULFATE HFA 108 (90 BASE) MCG/ACT IN AERS: 1.0000 | INHALATION_SPRAY | RESPIRATORY_TRACT | Status: DC | PRN

## 2020-07-04 MED ORDER — TAMSULOSIN HCL 0.4 MG PO CAPS
0.4000 mg | ORAL_CAPSULE | Freq: Every day | ORAL | Status: DC
  Administered 2020-07-04 – 2020-07-07 (×4): 0.4 mg via ORAL
  Filled 2020-07-04 (×4): qty 1

## 2020-07-04 MED ORDER — GUAIFENESIN-DM 100-10 MG/5ML PO SYRP: 5.0000 mL | ORAL_SOLUTION | ORAL | Status: DC | PRN

## 2020-07-04 MED ORDER — IPRATROPIUM-ALBUTEROL 20-100 MCG/ACT IN AERS
1.0000 | INHALATION_SPRAY | Freq: Four times a day (QID) | RESPIRATORY_TRACT | Status: DC
  Administered 2020-07-04 – 2020-07-07 (×12): 1 via RESPIRATORY_TRACT
  Filled 2020-07-04: qty 4

## 2020-07-04 NOTE — Care Management Important Message (Signed)
Important Message  Patient Details IM Letter placed in door caddy to be given to the Patient Name: John Houston MRN: [id number] Date of Birth: March 26, 1951   Medicare Important Message Given:  Yes     Kerin Salen 07/04/2020, 12:07 PM

## 2020-07-04 NOTE — Progress Notes (Signed)
PROGRESS NOTE    John Houston  HBZ:[id number] DOB: 09-15-1950 DOA: 07/02/2020 PCP: John Infante, MD    Brief Narrative:  John Houston was admitted to the hospital working diagnosis of acute hypoxic respiratory failure due to SARS COVID-19 viral pneumonia.  69 year old male with past medical history for BPH who presents with 2 weeks of fevers, cough and dyspnea.  Over the last few days prior to hospitalization his dyspnea worsened prompting him to come to the hospital.  On his initial physical examination blood pressure 139/79, heart rate 71, respiratory 24, temperature 99.2, oxygen saturation 96%, lungs clear to auscultation bilaterally, heart S1-S2, present rhythmic, soft abdomen, no lower extremity edema. SARS COVID-19 positive.  Chest radiograph with bilateral interstitial infiltrates, right upper lobe and left lower lobe, CT chest with bilateral patchy infiltrates, predominantly periphery.  No pulmonary embolism.  Assessment & Plan:   Principal Problem:   Pneumonia due to COVID-19 virus Active Problems:   Leukocytosis   Acute respiratory failure with hypoxia (HCC)   1.  Acute hypoxic respiratory failure due to SARS COVID-19 viral pneumonia  RR: 18  Pulse oxymetry: 94% Fi02: 10 L/min per HFNC  COVID-19 Labs  Recent Labs    07/02/20 1736 07/03/20 0031 07/04/20 0327 07/04/20 0331  DDIMER 1.72* 1.55* 1.14*  --   FERRITIN 1,298*  --   --   --   LDH 326*  --   --   --   CRP 14.8* 13.6*  --  11.5*    Lab Results  Component Value Date   SARSCOV2NAA POSITIVE (A) 07/02/2020   John Houston NEGATIVE 11/01/2019    Inflammatory markers continue to be elevated but trending down, patient with high oxygen requirements.  Dyspnea has been improving but not yet back to baseline.   Continue medical therapy with baricitinib, systemic steroids with methylprednisolone (80 mg IV q12), and remdesivir #2/5. On antitussive agents, bronchodilators and airway clearing techniques with  flutter valve and incentive spirometer.  Encourage prone position 4 hours at the time for a total of 16 hours per day as tolerated.   Continue to follow inflammatory markers, keep oxygen saturation more than 88%. Out of bed to chair tid with meals. PT and OT evaluation.   2. BPH. No signs of urinary retention.   3. Elevated LFTS. Likely due to viral process, COVID 19, continue close monitoring.   4. Reactive leukocytosis. No clinical signs of bacterial infection, continue to hold on antibiotic therapy.   Patient continue to be at high risk for worsening respiratory failure   Status is: Inpatient  Remains inpatient appropriate because:IV treatments appropriate due to intensity of illness or inability to take PO   Dispo: The patient is from: Home              Anticipated d/c is to: Home              Anticipated d/c date is: 3 days              Patient currently is not medically stable to d/c.   DVT prophylaxis: Enoxaparin   Code Status:   full  Family Communication:  No family at the bedside, patient is communicating with his family using his phone.      Subjective: Patient is feeling better, but continue to have dyspnea with minimal efforts, no nausea or vomiting, no chest pain,.   Objective: Vitals:   07/03/20 1056 07/03/20 1402 07/03/20 2125 07/04/20 0455  BP: 113/71 117/75 133/83 131/87  Pulse:  79 77 77 68  Resp: 18 18 16 18   Temp: 98.1 F (36.7 C) 98.2 F (36.8 C) 98.2 F (36.8 C) (!) 97.5 F (36.4 C)  TempSrc: Oral Oral Oral Oral  SpO2: 95% 96% 95% 94%  Weight:      Height:        Intake/Output Summary (Last 24 hours) at 07/04/2020 1228 Last data filed at 07/03/2020 1800 Gross per 24 hour  Intake 240 ml  Output --  Net 240 ml   Filed Weights   07/02/20 1712  Weight: 79.4 kg    Examination:   General: Not in pain or dyspnea at rest Neurology: Awake and alert, non focal  E ENT: mild pallor, no icterus, oral mucosa moist Cardiovascular: No JVD.  S1-S2 present, rhythmic, no gallops, rubs, or murmurs. No lower extremity edema. Pulmonary: positive breath sounds bilaterally, with no wheezing.  Gastrointestinal. Abdomen soft and non tender Skin. No rashes Musculoskeletal: no joint deformities     Data Reviewed: I have personally reviewed following labs and imaging studies  CBC: Recent Labs  Lab 07/02/20 1736 07/03/20 0031 07/04/20 0327  WBC 17.3* 15.4* 24.3*  NEUTROABS 12.4* 10.8* 19.3*  HGB 15.2 14.3 14.3  HCT 44.7 43.3 43.5  MCV 94.9 98.6 98.9  PLT 323 309 573   Basic Metabolic Panel: Recent Labs  Lab 07/02/20 1736 07/03/20 0031 07/04/20 0327  NA 138 136 136  K 4.2 4.9 4.4  CL 100 101 106  CO2 25 25 19*  GLUCOSE 110* 112* 160*  BUN 19 19 19   CREATININE 1.39* 1.23 0.93  CALCIUM 9.0 8.7* 8.9   GFR: Estimated Creatinine Clearance: 76 mL/min (by C-G formula based on SCr of 0.93 mg/dL). Liver Function Tests: Recent Labs  Lab 07/02/20 1736 07/03/20 0031 07/04/20 0327  AST 54* 52* 49*  ALT 77* 73* 84*  ALKPHOS 62 61 57  BILITOT 1.6* 1.8* 1.1  PROT 7.5 7.0 7.1  ALBUMIN 3.3* 3.1* 3.0*   No results for input(s): LIPASE, AMYLASE in the last 168 hours. No results for input(s): AMMONIA in the last 168 hours. Coagulation Profile: No results for input(s): INR, PROTIME in the last 168 hours. Cardiac Enzymes: No results for input(s): CKTOTAL, CKMB, CKMBINDEX, TROPONINI in the last 168 hours. BNP (last 3 results) No results for input(s): PROBNP in the last 8760 hours. HbA1C: No results for input(s): HGBA1C in the last 72 hours. CBG: No results for input(s): GLUCAP in the last 168 hours. Lipid Profile: Recent Labs    07/02/20 1736  TRIG 98   Thyroid Function Tests: No results for input(s): TSH, T4TOTAL, FREET4, T3FREE, THYROIDAB in the last 72 hours. Anemia Panel: Recent Labs    07/02/20 1736  FERRITIN 1,298*      Radiology Studies: I have reviewed all of the imaging during this hospital visit  personally     Scheduled Meds: . baricitinib  4 mg Oral Daily  . enoxaparin (LOVENOX) injection  40 mg Subcutaneous Q24H  . methylPREDNISolone (SOLU-MEDROL) injection  80 mg Intravenous Q12H   Continuous Infusions: . remdesivir 100 mg in NS 100 mL 100 mg (07/04/20 1009)     LOS: 2 days        John Gawronski Gerome Apley, MD

## 2020-07-05 DIAGNOSIS — N400 Enlarged prostate without lower urinary tract symptoms: Secondary | ICD-10-CM

## 2020-07-05 LAB — FERRITIN: Ferritin: 994 ng/mL — ABNORMAL HIGH (ref 24–336)

## 2020-07-05 LAB — COMPREHENSIVE METABOLIC PANEL
ALT: 93 U/L — ABNORMAL HIGH (ref 0–44)
AST: 41 U/L (ref 15–41)
Albumin: 2.8 g/dL — ABNORMAL LOW (ref 3.5–5.0)
Alkaline Phosphatase: 48 U/L (ref 38–126)
Anion gap: 10 (ref 5–15)
BUN: 24 mg/dL — ABNORMAL HIGH (ref 8–23)
CO2: 24 mmol/L (ref 22–32)
Calcium: 9.2 mg/dL (ref 8.9–10.3)
Chloride: 109 mmol/L (ref 98–111)
Creatinine, Ser: 0.92 mg/dL (ref 0.61–1.24)
GFR, Estimated: >60 mL/min (ref >60)
Glucose, Bld: 151 mg/dL — ABNORMAL HIGH (ref 70–99)
Potassium: 5 mmol/L (ref 3.5–5.1)
Sodium: 143 mmol/L (ref 135–145)
Total Bilirubin: 1 mg/dL (ref 0.3–1.2)
Total Protein: 6.6 g/dL (ref 6.5–8.1)

## 2020-07-05 LAB — C-REACTIVE PROTEIN: CRP: 5.3 mg/dL — ABNORMAL HIGH (ref <1.0)

## 2020-07-05 LAB — D-DIMER, QUANTITATIVE: D-Dimer, Quant: 0.83 ug/mL-FEU — ABNORMAL HIGH (ref 0.00–0.50)

## 2020-07-05 NOTE — Progress Notes (Signed)
OT Cancellation Note  Patient Details Name: John Houston MRN: [id number] DOB: 07-01-1951   Cancelled Treatment:    Reason Eval/Treat Not Completed: OT screened, no needs identified, will sign off  [place], Thereasa Parkin 07/05/2020, 1:38 PM

## 2020-07-05 NOTE — Evaluation (Addendum)
Physical Therapy Evaluation Patient Details Name: John Houston MRN: [id number] DOB: 03-Oct-1950 Today's Date: 07/05/2020   History of Present Illness  69 year old male with past medical history for BPH who presents with 2 weeks of fevers, cough and dyspnea.Chest radiograph with bilateral interstitial infiltrates, right upper lobe and left lower lobe, CT chest with bilateral patchy infiltrates, predominantly periphery.  No pulmonary embolism.  Clinical Impression  The patient is up ad lib in room to BR on 5 L HFNC with 2 extensions. SPO2 at rest 96%.. patient ambulated x 60' on RA, SPO2 865. Ambulated x60' on 5 L  With SPO2 91%. Patient using flutter and IS routinely. Patient should progress to Dc home.  Pt admitted with above diagnosis.  Pt currently with functional limitations due to the deficits listed below (see PT Problem List). Pt will benefit from skilled PT to increase their independence and safety with mobility to allow discharge to the venue listed below.   Provided HEP for LE's and reviewed  How to perform, and Blue theraband for UE exercises.    Follow Up Recommendations No PT follow up    Equipment Recommendations  None recommended by PT    Recommendations for Other Services       Precautions / Restrictions Precautions Precaution Comments: monitor sats, trying to wean      Mobility  Bed Mobility Overal bed mobility: Independent                  Transfers Overall transfer level: Independent                  Ambulation/Gait Ambulation/Gait assistance: Independent Gait Distance (Feet): 60 Feet (x 2) Assistive device: None Gait Pattern/deviations: WFL(Within Functional Limits) Gait velocity: wfl   General Gait Details: therapist assisted with O2 tank and monitor  Stairs            Wheelchair Mobility    Modified Rankin (Stroke Patients Only)       Balance                                             Pertinent  Vitals/Pain Pain Assessment: No/denies pain    Home Living Family/patient expects to be discharged to:: Private residence Living Arrangements: Alone Available Help at Discharge: Family Type of Home: House Home Access: Stairs to enter   Technical brewer of Steps: 1 Home Layout: One level Home Equipment: None      Prior Function Level of Independence: Independent         Comments: works as Dance movement psychotherapist        Extremity/Trunk Assessment   Upper Extremity Assessment Upper Extremity Assessment: Overall WFL for tasks assessed    Lower Extremity Assessment Lower Extremity Assessment: Overall WFL for tasks assessed    Cervical / Trunk Assessment Cervical / Trunk Assessment: Normal  Communication   Communication: No difficulties  Cognition Arousal/Alertness: Awake/alert Behavior During Therapy: WFL for tasks assessed/performed Overall Cognitive Status: Within Functional Limits for tasks assessed                                        General Comments      Exercises Other Exercises Other Exercises: blue Tb for UE and leg extension seated x  10   Assessment/Plan    PT Assessment Patient needs continued PT services  PT Problem List Cardiopulmonary status limiting activity;Decreased mobility;Decreased activity tolerance       PT Treatment Interventions Gait training;Functional mobility training;Patient/family education;Therapeutic exercise;Therapeutic activities    PT Goals (Current goals can be found in the Care Plan section)  Acute Rehab PT Goals Patient Stated Goal: to go home PT Goal Formulation: With patient Time For Goal Achievement: 07/18/20 Potential to Achieve Goals: Good    Frequency Min 3X/week   Barriers to discharge        Co-evaluation               AM-PAC PT "6 Clicks" Mobility  Outcome Measure Help needed turning from your back to your side while in a flat bed without using bedrails?:  None Help needed moving from lying on your back to sitting on the side of a flat bed without using bedrails?: None Help needed moving to and from a bed to a chair (including a wheelchair)?: None Help needed standing up from a chair using your arms (e.g., wheelchair or bedside chair)?: None Help needed to walk in hospital room?: None Help needed climbing 3-5 steps with a railing? : None 6 Click Score: 24    End of Session Equipment Utilized During Treatment: Oxygen Activity Tolerance: Patient tolerated treatment well Patient left: in bed;with call bell/phone within reach Nurse Communication: Mobility status PT Visit Diagnosis: Difficulty in walking, not elsewhere classified (R26.2)    Time: 9509-3267 PT Time Calculation (min) (ACUTE ONLY): 29 min   Charges:   PT Evaluation $PT Eval Low Complexity: 1 Low PT Treatments $Gait Training: 8-22 mins        [place] Pager [phone] Office [phone]   Claretha Cooper 07/05/2020, 12:33 PM

## 2020-07-05 NOTE — Progress Notes (Signed)
PROGRESS NOTE    DAMONE FANCHER  NWG:[id number] DOB: 05/25/1951 DOA: 07/02/2020 PCP: Crist Infante, MD    Brief Narrative:  Mr. Gentle was admitted to the hospital working diagnosis of acute hypoxic respiratory failure due to SARS COVID-19 viral pneumonia.  69 year old male with past medical history for BPH who presents with 2 weeks of fevers, cough and dyspnea.  Over the last few days prior to hospitalization his dyspnea worsened prompting him to come to the hospital.  On his initial physical examination blood pressure 139/79, heart rate 71, respiratory 24, temperature 99.2, oxygen saturation 96%, lungs clear to auscultation bilaterally, heart S1-S2, present rhythmic, soft abdomen, no lower extremity edema. SARS COVID-19 positive.  Chest radiograph with bilateral interstitial infiltrates, right upper lobe and left lower lobe, CT chest with bilateral patchy infiltrates, predominantly periphery.  No pulmonary embolism.  Patient is responding well to medical therapy, with progressive decreased in oxygen requirements along with inflammatory markers.    Assessment & Plan:   Principal Problem:   Pneumonia due to COVID-19 virus Active Problems:   Leukocytosis   Acute respiratory failure with hypoxia (HCC)   1.  Acute hypoxic respiratory failure due to SARS COVID-19 viral pneumonia  RR: 18  Pulse oxymetry: 93%  Fi02: 5 L/min per [place]   COVID-19 Labs  Recent Labs    07/02/20 1736 07/02/20 1736 07/03/20 0031 07/04/20 0327 07/04/20 0331 07/05/20 0348  DDIMER 1.72*   < > 1.55* 1.14*  --  0.83*  FERRITIN 1,298*  --   --   --   --  994*  LDH 326*  --   --   --   --   --   CRP 14.8*   < > 13.6*  --  11.5* 5.3*   < > = values in this interval not displayed.    Lab Results  Component Value Date   SARSCOV2NAA POSITIVE (A) 07/02/2020   [place] NEGATIVE 11/01/2019    Significant improvement in oxygen requirements and inflammatory markers.   Tolerating well medical  therapy with baricitinib, methylprednisolone 80 mg IV q12, and remdesivir #3/5.  Continue with antitussive agents, bronchodilators and airway clearing techniques with flutter valve and incentive spirometer.  Patient getting out of bed to chair and ambulating in the room. No further follow up as outpatient for PT.   Follow on oxymetry and inflammatory markers, keep oxygen saturation more than 88%.   2. BPH. No current signs of urinary retention. Continue with tamsulosin.   3. Elevated LFTS.  Stable AST 41 and ALT 93,. Continue to follow on liver enzymes, patient with no abdominal pain, no nausea or vomiting.  4. Reactive leukocytosis. Follow cell count in am, no clinical signs of bacterial infection, continue to hold on antibiotic therapy.     Status is: Inpatient  Remains inpatient appropriate because:Inpatient level of care appropriate due to severity of illness   Dispo: The patient is from: Home              Anticipated d/c is to: Home              Anticipated d/c date is: 2 days              Patient currently is not medically stable to d/c. Possible dc home in 48 H if continue to improve.    DVT prophylaxis: Enoxaparin   Code Status:   full  Family Communication:  Patient communicating with his family.       Subjective: Patient  is feeling better, but not yet back to baseline, no nausea or vomiting, out of bed.   Objective: Vitals:   07/05/20 0449 07/05/20 0800 07/05/20 0810 07/05/20 0845  BP: 122/85     Pulse: 62     Resp: 18     Temp: 98 F (36.7 C)     TempSrc: Oral     SpO2: 98% 97% 95% 93%  Weight:      Height:        Intake/Output Summary (Last 24 hours) at 07/05/2020 1142 Last data filed at 07/04/2020 1800 Gross per 24 hour  Intake 100 ml  Output --  Net 100 ml   Filed Weights   07/02/20 1712  Weight: 79.4 kg    Examination:   General: Not in pain or dyspnea. Deconditioned  Neurology: Awake and alert, non focal  E ENT: no pallor, no  icterus, oral mucosa moist Cardiovascular: No JVD. S1-S2 present, rhythmic, no gallops, rubs, or murmurs. No lower extremity edema. Pulmonary: positive breath sounds bilaterally,  Gastrointestinal. Abdomen soft and non tender Skin. No rashes Musculoskeletal: no joint deformities     Data Reviewed: I have personally reviewed following labs and imaging studies  CBC: Recent Labs  Lab 07/02/20 1736 07/03/20 0031 07/04/20 0327  WBC 17.3* 15.4* 24.3*  NEUTROABS 12.4* 10.8* 19.3*  HGB 15.2 14.3 14.3  HCT 44.7 43.3 43.5  MCV 94.9 98.6 98.9  PLT 323 309 237   Basic Metabolic Panel: Recent Labs  Lab 07/02/20 1736 07/03/20 0031 07/04/20 0327 07/05/20 0348  NA 138 136 136 143  K 4.2 4.9 4.4 5.0  CL 100 101 106 109  CO2 25 25 19* 24  GLUCOSE 110* 112* 160* 151*  BUN 19 19 19  24*  CREATININE 1.39* 1.23 0.93 0.92  CALCIUM 9.0 8.7* 8.9 9.2   GFR: Estimated Creatinine Clearance: 76.8 mL/min (by C-G formula based on SCr of 0.92 mg/dL). Liver Function Tests: Recent Labs  Lab 07/02/20 1736 07/03/20 0031 07/04/20 0327 07/05/20 0348  AST 54* 52* 49* 41  ALT 77* 73* 84* 93*  ALKPHOS 62 61 57 48  BILITOT 1.6* 1.8* 1.1 1.0  PROT 7.5 7.0 7.1 6.6  ALBUMIN 3.3* 3.1* 3.0* 2.8*   No results for input(s): LIPASE, AMYLASE in the last 168 hours. No results for input(s): AMMONIA in the last 168 hours. Coagulation Profile: No results for input(s): INR, PROTIME in the last 168 hours. Cardiac Enzymes: No results for input(s): CKTOTAL, CKMB, CKMBINDEX, TROPONINI in the last 168 hours. BNP (last 3 results) No results for input(s): PROBNP in the last 8760 hours. HbA1C: No results for input(s): HGBA1C in the last 72 hours. CBG: No results for input(s): GLUCAP in the last 168 hours. Lipid Profile: Recent Labs    07/02/20 1736  TRIG 98   Thyroid Function Tests: No results for input(s): TSH, T4TOTAL, FREET4, T3FREE, THYROIDAB in the last 72 hours. Anemia Panel: Recent Labs     07/02/20 1736 07/05/20 0348  FERRITIN 1,298* 994*      Radiology Studies: I have reviewed all of the imaging during this hospital visit personally     Scheduled Meds: . baricitinib  4 mg Oral Daily  . chlorpheniramine-HYDROcodone  5 mL Oral Q12H  . enoxaparin (LOVENOX) injection  40 mg Subcutaneous Q24H  . Ipratropium-Albuterol  1 puff Inhalation QID  . methylPREDNISolone (SOLU-MEDROL) injection  80 mg Intravenous Q12H  . tamsulosin  0.4 mg Oral Daily   Continuous Infusions: . remdesivir 100 mg in NS  100 mL 100 mg (07/05/20 1000)     LOS: 3 days        Haivyn Oravec Gerome Apley, MD

## 2020-07-06 LAB — CBC WITH DIFFERENTIAL/PLATELET
Abs Immature Granulocytes: 0.95 10*3/uL — ABNORMAL HIGH (ref 0.00–0.07)
Basophils Absolute: 0.1 10*3/uL (ref 0.0–0.1)
Basophils Relative: 0 %
Eosinophils Absolute: 0 10*3/uL (ref 0.0–0.5)
Eosinophils Relative: 0 %
HCT: 38.6 % — ABNORMAL LOW (ref 39.0–52.0)
Hemoglobin: 13 g/dL (ref 13.0–17.0)
Immature Granulocytes: 4 %
Lymphocytes Relative: 6 %
Lymphs Abs: 1.3 10*3/uL (ref 0.7–4.0)
MCH: 33.1 pg (ref 26.0–34.0)
MCHC: 33.7 g/dL (ref 30.0–36.0)
MCV: 98.2 fL (ref 80.0–100.0)
Monocytes Absolute: 1 10*3/uL (ref 0.1–1.0)
Monocytes Relative: 4 %
Neutro Abs: 20 10*3/uL — ABNORMAL HIGH (ref 1.7–7.7)
Neutrophils Relative %: 86 %
Platelets: 365 10*3/uL (ref 150–400)
RBC: 3.93 MIL/uL — ABNORMAL LOW (ref 4.22–5.81)
RDW: 12.3 % (ref 11.5–15.5)
WBC: 23.4 10*3/uL — ABNORMAL HIGH (ref 4.0–10.5)
nRBC: 0 % (ref 0.0–0.2)

## 2020-07-06 LAB — COMPREHENSIVE METABOLIC PANEL
ALT: 120 U/L — ABNORMAL HIGH (ref 0–44)
AST: 50 U/L — ABNORMAL HIGH (ref 15–41)
Albumin: 2.9 g/dL — ABNORMAL LOW (ref 3.5–5.0)
Alkaline Phosphatase: 49 U/L (ref 38–126)
Anion gap: 7 (ref 5–15)
BUN: 24 mg/dL — ABNORMAL HIGH (ref 8–23)
CO2: 27 mmol/L (ref 22–32)
Calcium: 9 mg/dL (ref 8.9–10.3)
Chloride: 106 mmol/L (ref 98–111)
Creatinine, Ser: 0.91 mg/dL (ref 0.61–1.24)
GFR, Estimated: >60 mL/min (ref >60)
Glucose, Bld: 145 mg/dL — ABNORMAL HIGH (ref 70–99)
Potassium: 4.8 mmol/L (ref 3.5–5.1)
Sodium: 140 mmol/L (ref 135–145)
Total Bilirubin: 0.9 mg/dL (ref 0.3–1.2)
Total Protein: 6.5 g/dL (ref 6.5–8.1)

## 2020-07-06 LAB — C-REACTIVE PROTEIN: CRP: 2.8 mg/dL — ABNORMAL HIGH (ref <1.0)

## 2020-07-06 LAB — D-DIMER, QUANTITATIVE: D-Dimer, Quant: 0.69 ug/mL-FEU — ABNORMAL HIGH (ref 0.00–0.50)

## 2020-07-06 LAB — FERRITIN: Ferritin: 968 ng/mL — ABNORMAL HIGH (ref 24–336)

## 2020-07-06 NOTE — Progress Notes (Signed)
Patient ID: John Houston, male   DOB: 1950-09-20, 69 y.o.   MRN: [id number]  PROGRESS NOTE    TYSIN SALADA  WUJ:[id number] DOB: Feb 09, 1951 DOA: 07/02/2020 PCP: Crist Infante, MD    Brief Narrative:  John Houston is a 69 y.o. male with a history of prior hospitalization for MRSA and recent covid-19 diagnosis who presented to the ED 10/27 with continued progressive dyspnea for 1-2 weeks. On arrival SpO2 78% on room air, placed on 8L HFNC. CXR revealed multifocal patchy opacities confirmed on CTA chest which showed no PE. Remdesivir and steroids started, and the patient was admitted to [facility] this morning by Dr. Alcario Drought for acute hypoxic respiratory failure due to covid-19 pneumonia.   Assessment & Plan:   Principal Problem:   Pneumonia due to COVID-19 virus Active Problems:   Leukocytosis   Acute respiratory failure with hypoxia (HCC)  Acute hypoxemic respiratory failure due to covid-19 pneumonia: SARS-CoV-2 PCR confirmed positive on 10/27, reportedly positive testing prior to that.  - Continue remdesivir day #4/5 - Continue solumedrol 2mg /kg/day div. q12h -Continue baricitinib - Encourage OOB, IS, FV, and awake proning if able - Continue airborne, contact precautions for 21 days from positive testing. - Monitor CMP and inflammatory markers-trending down today -Continue oxygen therapy currently on 3 L high flow nasal cannula with oxygen saturations in the 97 percentile - Enoxaparin prophylactic dose.     LFT elevation: Likely due to covid. Not a contraindication to therapies at this point.  - Continue monitoring.   Leukocytosis Likely reactive related to steroid use.   DVT prophylaxis: Lovenox SQ Code Status: Full code  Family Communication: Patient at bedside Disposition Plan: Home in next 24 to 48 hours pending improvement  Patient remains candidate for inpatient due to IV medication use and ongoing oxygen needs.   Consultants:    None  Procedures:  None  Antimicrobials: Anti-infectives (From admission, onward)   Start     Dose/Rate Route Frequency Ordered Stop   07/04/20 1000  remdesivir 100 mg in sodium chloride 0.9 % 100 mL IVPB       "Followed by" Linked Group Details   100 mg 200 mL/hr over 30 Minutes Intravenous Daily 07/03/20 0001 07/08/20 0959   07/03/20 0100  remdesivir 200 mg in sodium chloride 0.9% 250 mL IVPB       "Followed by" Linked Group Details   200 mg 580 mL/hr over 30 Minutes Intravenous Once 07/03/20 0001 07/03/20 0114       Subjective: Patient reports feeling better today his breathing feels improved.  Objective: Vitals:   07/05/20 1800 07/05/20 2126 07/06/20 0549 07/06/20 1345  BP:  137/84 121/88 131/81  Pulse:  78 (!) 58 74  Resp:  17 16 18   Temp:  98.1 F (36.7 C) 97.8 F (36.6 C) 97.9 F (36.6 C)  TempSrc:  Oral Oral Oral  SpO2: 93% 94% 97% 96%  Weight:      Height:        Intake/Output Summary (Last 24 hours) at 07/06/2020 1552 Last data filed at 07/06/2020 0900 Gross per 24 hour  Intake 240 ml  Output --  Net 240 ml   Filed Weights   07/02/20 1712  Weight: 79.4 kg    Examination:  General exam: Appears calm and comfortable  Respiratory system: Clear to auscultation. Respiratory effort normal. Cardiovascular system: S1 & S2 heard, RRR.  Gastrointestinal system: Abdomen is nondistended, soft and nontender.  Central nervous system: Alert and oriented. No focal neurological  deficits. Extremities: Symmetric  Skin: No rashes Psychiatry: Judgement and insight appear normal. Mood & affect appropriate.     Data Reviewed: I have personally reviewed following labs and imaging studies  CBC: Recent Labs  Lab 07/02/20 1736 07/03/20 0031 07/04/20 0327 07/06/20 0514  WBC 17.3* 15.4* 24.3* 23.4*  NEUTROABS 12.4* 10.8* 19.3* 20.0*  HGB 15.2 14.3 14.3 13.0  HCT 44.7 43.3 43.5 38.6*  MCV 94.9 98.6 98.9 98.2  PLT 323 309 311 683   Basic Metabolic  Panel: Recent Labs  Lab 07/02/20 1736 07/03/20 0031 07/04/20 0327 07/05/20 0348 07/06/20 0514  NA 138 136 136 143 140  K 4.2 4.9 4.4 5.0 4.8  CL 100 101 106 109 106  CO2 25 25 19* 24 27  GLUCOSE 110* 112* 160* 151* 145*  BUN 19 19 19  24* 24*  CREATININE 1.39* 1.23 0.93 0.92 0.91  CALCIUM 9.0 8.7* 8.9 9.2 9.0   GFR: Estimated Creatinine Clearance: 77.7 mL/min (by C-G formula based on SCr of 0.91 mg/dL). Liver Function Tests: Recent Labs  Lab 07/02/20 1736 07/03/20 0031 07/04/20 0327 07/05/20 0348 07/06/20 0514  AST 54* 52* 49* 41 50*  ALT 77* 73* 84* 93* 120*  ALKPHOS 62 61 57 48 49  BILITOT 1.6* 1.8* 1.1 1.0 0.9  PROT 7.5 7.0 7.1 6.6 6.5  ALBUMIN 3.3* 3.1* 3.0* 2.8* 2.9*   Anemia Panel: Recent Labs    07/05/20 0348 07/06/20 0514  FERRITIN 994* 968*   Sepsis Labs: Recent Labs  Lab 07/02/20 1736 07/03/20 0031  PROCALCITON <0.10 <0.10  LATICACIDVEN 1.4  --     Recent Results (from the past 240 hour(s))  Respiratory Panel by RT PCR (Flu A&B, Covid) - Nasopharyngeal Swab     Status: Abnormal   Collection Time: 07/02/20  5:36 PM   Specimen: Nasopharyngeal Swab  Result Value Ref Range Status   SARS Coronavirus 2 by RT PCR POSITIVE (A) NEGATIVE Final    Comment: RESULT CALLED TO, READ BACK BY AND VERIFIED WITH: Raynelle Bring, RN @ 4196 ON 07/02/2020, [email or web address] (NOTE) SARS-CoV-2 target nucleic acids are DETECTED.  SARS-CoV-2 RNA is generally detectable in upper respiratory specimens  during the acute phase of infection. Positive results are indicative of the presence of the identified virus, but do not rule out bacterial infection or co-infection with other pathogens not detected by the test. Clinical correlation with patient history and other diagnostic information is necessary to determine patient infection status. The expected result is Negative.  Fact Sheet for Patients:  PinkCheek.be  Fact Sheet for Healthcare  Providers: GravelBags.it  This test is not yet approved or cleared by the [place] FDA and  has been authorized for detection and/or diagnosis of SARS-CoV-2 by FDA under an Emergency Use Authorization (EUA).  This EUA will remain in effect (meaning t his test can be used) for the duration of  the COVID-19 declaration under Section 564(b)(1) of the Act, 21 U.S.C. section 360bbb-3(b)(1), unless the authorization is terminated or revoked sooner.      Influenza A by PCR NEGATIVE NEGATIVE Final   Influenza B by PCR NEGATIVE NEGATIVE Final    Comment: (NOTE) The Xpert Xpress SARS-CoV-2/FLU/RSV assay is intended as an aid in  the diagnosis of influenza from Nasopharyngeal swab specimens and  should not be used as a sole basis for treatment. Nasal washings and  aspirates are unacceptable for Xpert Xpress SARS-CoV-2/FLU/RSV  testing.  Fact Sheet for Patients: PinkCheek.be  Fact Sheet for Healthcare Providers: GravelBags.it  This  test is not yet approved or cleared by the [place] and  has been authorized for detection and/or diagnosis of SARS-CoV-2 by  FDA under an Emergency Use Authorization (EUA). This EUA will remain  in effect (meaning this test can be used) for the duration of the  Covid-19 declaration under Section 564(b)(1) of the Act, 21  U.S.C. section 360bbb-3(b)(1), unless the authorization is  terminated or revoked. Performed at [facility], [address] [place], [place] [address]   Blood Culture (routine x 2)     Status: None (Preliminary result)   Collection Time: 07/02/20  5:37 PM   Specimen: BLOOD  Result Value Ref Range Status   Specimen Description   Final    BLOOD RIGHT ANTECUBITAL Performed at [facility], [place] [address] [place], [place] [address]    Special Requests   Final    BOTTLES DRAWN AEROBIC AND ANAEROBIC Blood Culture adequate  volume Performed at [facility], [address] [place], [place] [address]    Culture   Final    NO GROWTH 4 DAYS Performed at [facility], [place] [address] [place], [place] [address]    Report Status PENDING  Incomplete  Blood Culture (routine x 2)     Status: None (Preliminary result)   Collection Time: 07/02/20  5:56 PM   Specimen: BLOOD  Result Value Ref Range Status   Specimen Description   Final    BLOOD BLOOD LEFT FOREARM Performed at [facility], [address] [place], [place] [address]    Special Requests   Final    BOTTLES DRAWN AEROBIC AND ANAEROBIC Blood Culture adequate volume Performed at [facility], [address] [place], [place] [address]    Culture   Final    NO GROWTH 4 DAYS Performed at [facility], [place] [address], [place], [place] [address]    Report Status PENDING  Incomplete      Radiology Studies: No results found.   Scheduled Meds: . baricitinib  4 mg Oral Daily  . chlorpheniramine-HYDROcodone  5 mL Oral Q12H  . enoxaparin (LOVENOX) injection  40 mg Subcutaneous Q24H  . Ipratropium-Albuterol  1 puff Inhalation QID  . methylPREDNISolone (SOLU-MEDROL) injection  80 mg Intravenous Q12H  . tamsulosin  0.4 mg Oral Daily   Continuous Infusions: . remdesivir 100 mg in NS 100 mL 100 mg (07/06/20 1119)     LOS: 4 days    Donnamae Jude, MD 07/06/2020 3:53 PM [phone] [facility] If 7PM-7AM, please contact night-coverage 07/06/2020, 3:53 PM

## 2020-07-06 NOTE — Plan of Care (Signed)

## 2020-07-07 LAB — CULTURE, BLOOD (ROUTINE X 2)
Culture: NO GROWTH
Culture: NO GROWTH
Special Requests: ADEQUATE
Special Requests: ADEQUATE

## 2020-07-07 LAB — COMPREHENSIVE METABOLIC PANEL
ALT: 118 U/L — ABNORMAL HIGH (ref 0–44)
AST: 42 U/L — ABNORMAL HIGH (ref 15–41)
Albumin: 3 g/dL — ABNORMAL LOW (ref 3.5–5.0)
Alkaline Phosphatase: 47 U/L (ref 38–126)
Anion gap: 12 (ref 5–15)
BUN: 25 mg/dL — ABNORMAL HIGH (ref 8–23)
CO2: 25 mmol/L (ref 22–32)
Calcium: 9 mg/dL (ref 8.9–10.3)
Chloride: 103 mmol/L (ref 98–111)
Creatinine, Ser: 1 mg/dL (ref 0.61–1.24)
GFR, Estimated: >60 mL/min (ref >60)
Glucose, Bld: 191 mg/dL — ABNORMAL HIGH (ref 70–99)
Potassium: 4.7 mmol/L (ref 3.5–5.1)
Sodium: 140 mmol/L (ref 135–145)
Total Bilirubin: 0.9 mg/dL (ref 0.3–1.2)
Total Protein: 6.4 g/dL — ABNORMAL LOW (ref 6.5–8.1)

## 2020-07-07 LAB — D-DIMER, QUANTITATIVE: D-Dimer, Quant: 0.58 ug/mL-FEU — ABNORMAL HIGH (ref 0.00–0.50)

## 2020-07-07 LAB — FERRITIN: Ferritin: 893 ng/mL — ABNORMAL HIGH (ref 24–336)

## 2020-07-07 LAB — C-REACTIVE PROTEIN: CRP: 1.6 mg/dL — ABNORMAL HIGH (ref <1.0)

## 2020-07-07 MED ORDER — GUAIFENESIN-DM 100-10 MG/5ML PO SYRP: 5.0000 mL | ORAL_SOLUTION | ORAL | 0 refills | Status: DC | PRN

## 2020-07-07 MED ORDER — ALBUTEROL SULFATE HFA 108 (90 BASE) MCG/ACT IN AERS: 1.0000 | INHALATION_SPRAY | RESPIRATORY_TRACT | 0 refills | Status: DC | PRN

## 2020-07-07 MED ORDER — ONDANSETRON HCL 4 MG PO TABS: 4.0000 mg | ORAL_TABLET | Freq: Four times a day (QID) | ORAL | 0 refills | Status: DC | PRN

## 2020-07-07 MED ORDER — PREDNISONE 10 MG PO TABS: 40.0000 mg | ORAL_TABLET | Freq: Every day | ORAL | 0 refills | Status: AC

## 2020-07-07 NOTE — Progress Notes (Signed)
SATURATION QUALIFICATIONS: (This note is used to comply with regulatory documentation for home oxygen)  Patient Saturations on Room Air at Rest = 91%  Patient Saturations on Room Air while Ambulating = 86%  Patient Saturations on 3 Liters of oxygen while Ambulating = 90%  Please briefly explain why patient needs home oxygen:  Patient requires 3L of oxygen to maintain saturations at an acceptable level.

## 2020-07-07 NOTE — Discharge Summary (Signed)
Physician Discharge Summary  John Houston IWP:[id number] DOB: 11-27-1950 DOA: 07/02/2020  PCP: Crist Infante, MD  Admit date: 07/02/2020 Discharge date: 07/07/2020  Admitted From: Home  Discharge disposition: Home   Recommendations for Outpatient Follow-Up:   . Follow up with your primary care provider in 1 to 2 weeks . Check CBC, BMP, LFT, magnesium in the next visit . Follow-up chest x-ray in 4 to 6 weeks.  Discharge Diagnosis:   Principal Problem:   Pneumonia due to COVID-19 virus Active Problems:   Leukocytosis   Acute respiratory failure with hypoxia ([place])   Discharge Condition: Improved.  Diet recommendation: Regular  Wound care: None.  Code status: Full.   History of Present Illness:   John Houston N6 y.o.malewith a history of prior hospitalization for MRSA and recent covid-19 diagnosis who presented to the ED 07/02/20 with continued progressive dyspnea for 1-2 weeks. On arrival to the ED, SpO2 was 78% on room air, placed on 8L HFNC. CXR revealed multifocal patchy opacities confirmed on CTA chest which showed no PE. Remdesivir and steroids started, and the patient was admitted to [facility]  for acute hypoxic respiratory failure due to covid-19 pneumonia.  Hospital Course:   Following conditions were addressed during hospitalization as listed below,  Acute hypoxemic respiratory failure due to covid-19 pneumonia: Patient tested positive on positive on 10/27, on remdesivir day 5/5.    Received Solu-Medrol IV high-dose during hospitalization including baricitinib.  Trending down inflammatory biomarkers. Currently on 3 L of oxygen by nasal cannula.  CT scan of the chest showed multifocal pneumonia.  We will continue with prednisone on discharge.  Patient was advised to continue to use oxygen at home and follow-up with his primary care physician in 1 to 2 weeks.  Elevated LFTs.  Mildly elevated.    Will need to follow-up as  outpatient.  Leukocytosis Likely reactive related to steroid use.    Disposition.  At this time, patient is stable for disposition.Spoke with the patient's daughter on phone and updated her about the clinical condition of the patient and the plan for disposition home today.  Patient will be discharged home on 3 L of oxygen by nasal cannula.   Medical Consultants:    None.  Procedures:    None Subjective:   Today, patient was seen and examined at bedside.  Patient feels better.  Denies any chest pain, fever, chills or rigor.  Wants to go home.  Discharge Exam:   Vitals:   07/06/20 2131 07/07/20 0533  BP: 130/75 131/86  Pulse: 82 72  Resp: 20 18  Temp: 97.9 F (36.6 C) 98 F (36.7 C)  SpO2: 97% 97%   Vitals:   07/06/20 0549 07/06/20 1345 07/06/20 2131 07/07/20 0533  BP: 121/88 131/81 130/75 131/86  Pulse: (!) 58 74 82 72  Resp: 16 18 20 18   Temp: 97.8 F (36.6 C) 97.9 F (36.6 C) 97.9 F (36.6 C) 98 F (36.7 C)  TempSrc: Oral Oral Oral Oral  SpO2: 97% 96% 97% 97%  Weight:      Height:        General: Alert awake, not in obvious distress, on 3 L of nasal cannula oxygen HENT: pupils equally reacting to light,  No scleral pallor or icterus noted. Oral mucosa is moist.  Chest: Diminished breath sounds bilaterally..  CVS: S1 &S2 heard. No murmur.  Regular rate and rhythm. Abdomen: Soft, nontender, nondistended.  Bowel sounds are heard.   Extremities: No cyanosis, clubbing or edema.  Peripheral pulses are palpable. Psych: Alert, awake and oriented, normal mood CNS:  No cranial nerve deficits.  Power equal in all extremities.   Skin: Warm and dry.  No rashes noted.  The results of significant diagnostics from this hospitalization (including imaging, microbiology, ancillary and laboratory) are listed below for reference.     Diagnostic Studies:   CT Angio Chest PE W/Cm &/Or Wo Cm  Result Date: 07/02/2020 CLINICAL DATA:  COVID positive shortness of breath  EXAM: CT ANGIOGRAPHY CHEST WITH CONTRAST TECHNIQUE: Multidetector CT imaging of the chest was performed using the standard protocol during bolus administration of intravenous contrast. Multiplanar CT image reconstructions and MIPs were obtained to evaluate the vascular anatomy. CONTRAST:  35mL OMNIPAQUE IOHEXOL 350 MG/ML SOLN COMPARISON:  None. FINDINGS: Cardiovascular: There is a optimal opacification of the pulmonary arteries. There is no central,segmental, or subsegmental filling defects within the pulmonary arteries. The heart is normal in size. No pericardial effusion or thickening. No evidence right heart strain. There is normal three-vessel brachiocephalic anatomy without proximal stenosis. The thoracic aorta is normal in appearance. Mediastinum/Nodes: No hilar, mediastinal, or axillary adenopathy. Thyroid gland, trachea, and esophagus demonstrate no significant findings. Lungs/Pleura: Multifocal patchy ground-glass opacities are seen throughout both lungs, predominantly throughout both lung bases. No pleural effusion or pneumothorax. Upper Abdomen: No acute abnormalities present in the visualized portions of the upper abdomen. Musculoskeletal: No chest wall abnormality. No acute or significant osseous findings. Review of the MIP images confirms the above findings. IMPRESSION: No central, segmental, or subsegmental pulmonary embolism. Extensive multifocal airspace opacities, consistent with atypical viral pneumonia Electronically Signed   By: Prudencio Pair M.D.   On: 07/02/2020 20:29   DG Chest Port 1 View  Result Date: 07/02/2020 CLINICAL DATA:  COVID, hypoxia EXAM: PORTABLE CHEST 1 VIEW COMPARISON:  11/01/2019 chest radiograph. FINDINGS: Stable cardiomediastinal silhouette with normal heart size. No pneumothorax. No pleural effusion. Extensive patchy opacities throughout the peripheral lungs bilaterally. IMPRESSION: Extensive patchy opacities throughout the peripheral lungs bilaterally, compatible with  COVID-19 pneumonia. Electronically Signed   By: Ilona Sorrel M.D.   On: 07/02/2020 18:28     Labs:   Basic Metabolic Panel: Recent Labs  Lab 07/03/20 0031 07/03/20 0031 07/04/20 0327 07/04/20 0327 07/05/20 0348 07/05/20 0348 07/06/20 0514 07/07/20 0337  NA 136  --  136  --  143  --  140 140  K 4.9   < > 4.4   < > 5.0   < > 4.8 4.7  CL 101  --  106  --  109  --  106 103  CO2 25  --  19*  --  24  --  27 25  GLUCOSE 112*  --  160*  --  151*  --  145* 191*  BUN 19  --  19  --  24*  --  24* 25*  CREATININE 1.23  --  0.93  --  0.92  --  0.91 1.00  CALCIUM 8.7*  --  8.9  --  9.2  --  9.0 9.0   < > = values in this interval not displayed.   GFR Estimated Creatinine Clearance: 70.7 mL/min (by C-G formula based on SCr of 1 mg/dL). Liver Function Tests: Recent Labs  Lab 07/03/20 0031 07/04/20 0327 07/05/20 0348 07/06/20 0514 07/07/20 0337  AST 52* 49* 41 50* 42*  ALT 73* 84* 93* 120* 118*  ALKPHOS 61 57 48 49 47  BILITOT 1.8* 1.1 1.0 0.9 0.9  PROT 7.0 7.1  6.6 6.5 6.4*  ALBUMIN 3.1* 3.0* 2.8* 2.9* 3.0*   No results for input(s): LIPASE, AMYLASE in the last 168 hours. No results for input(s): AMMONIA in the last 168 hours. Coagulation profile No results for input(s): INR, PROTIME in the last 168 hours.  CBC: Recent Labs  Lab 07/02/20 1736 07/03/20 0031 07/04/20 0327 07/06/20 0514  WBC 17.3* 15.4* 24.3* 23.4*  NEUTROABS 12.4* 10.8* 19.3* 20.0*  HGB 15.2 14.3 14.3 13.0  HCT 44.7 43.3 43.5 38.6*  MCV 94.9 98.6 98.9 98.2  PLT 323 309 311 365   Cardiac Enzymes: No results for input(s): CKTOTAL, CKMB, CKMBINDEX, TROPONINI in the last 168 hours. BNP: Invalid input(s): POCBNP CBG: No results for input(s): GLUCAP in the last 168 hours. D-Dimer Recent Labs    07/06/20 0514 07/07/20 0337  DDIMER 0.69* 0.58*   Hgb A1c No results for input(s): HGBA1C in the last 72 hours. Lipid Profile No results for input(s): CHOL, HDL, LDLCALC, TRIG, CHOLHDL, LDLDIRECT in the  last 72 hours. Thyroid function studies No results for input(s): TSH, T4TOTAL, T3FREE, THYROIDAB in the last 72 hours.  Invalid input(s): FREET3 Anemia work up Recent Labs    07/06/20 0514 07/07/20 0337  FERRITIN 968* 893*   Microbiology Recent Results (from the past 240 hour(s))  Respiratory Panel by RT PCR (Flu A&B, Covid) - Nasopharyngeal Swab     Status: Abnormal   Collection Time: 07/02/20  5:36 PM   Specimen: Nasopharyngeal Swab  Result Value Ref Range Status   SARS Coronavirus 2 by RT PCR POSITIVE (A) NEGATIVE Final    Comment: RESULT CALLED TO, READ BACK BY AND VERIFIED WITH: Raynelle Bring, RN @ 2409 ON 07/02/2020, [email or web address] (NOTE) SARS-CoV-2 target nucleic acids are DETECTED.  SARS-CoV-2 RNA is generally detectable in upper respiratory specimens  during the acute phase of infection. Positive results are indicative of the presence of the identified virus, but do not rule out bacterial infection or co-infection with other pathogens not detected by the test. Clinical correlation with patient history and other diagnostic information is necessary to determine patient infection status. The expected result is Negative.  Fact Sheet for Patients:  PinkCheek.be  Fact Sheet for Healthcare Providers: GravelBags.it  This test is not yet approved or cleared by the [place] FDA and  has been authorized for detection and/or diagnosis of SARS-CoV-2 by FDA under an Emergency Use Authorization (EUA).  This EUA will remain in effect (meaning t his test can be used) for the duration of  the COVID-19 declaration under Section 564(b)(1) of the Act, 21 U.S.C. section 360bbb-3(b)(1), unless the authorization is terminated or revoked sooner.      Influenza A by PCR NEGATIVE NEGATIVE Final   Influenza B by PCR NEGATIVE NEGATIVE Final    Comment: (NOTE) The Xpert Xpress SARS-CoV-2/FLU/RSV assay is intended as an aid in   the diagnosis of influenza from Nasopharyngeal swab specimens and  should not be used as a sole basis for treatment. Nasal washings and  aspirates are unacceptable for Xpert Xpress SARS-CoV-2/FLU/RSV  testing.  Fact Sheet for Patients: PinkCheek.be  Fact Sheet for Healthcare Providers: GravelBags.it  This test is not yet approved or cleared by the [place] FDA and  has been authorized for detection and/or diagnosis of SARS-CoV-2 by  FDA under an Emergency Use Authorization (EUA). This EUA will remain  in effect (meaning this test can be used) for the duration of the  Covid-19 declaration under Section 564(b)(1) of the Act, 21  U.S.C. section  360bbb-3(b)(1), unless the authorization is  terminated or revoked. Performed at [facility], [address] [place], [place] [address]   Blood Culture (routine x 2)     Status: None   Collection Time: 07/02/20  5:37 PM   Specimen: BLOOD  Result Value Ref Range Status   Specimen Description   Final    BLOOD RIGHT ANTECUBITAL Performed at [facility], [place] [address] [place], [place] [address]    Special Requests   Final    BOTTLES DRAWN AEROBIC AND ANAEROBIC Blood Culture adequate volume Performed at [facility], [address] [place], [place] [address]    Culture   Final    NO GROWTH 5 DAYS Performed at [facility], [place] [address], [place], [place] [address]    Report Status 07/07/2020 FINAL  Final  Blood Culture (routine x 2)     Status: None   Collection Time: 07/02/20  5:56 PM   Specimen: BLOOD  Result Value Ref Range Status   Specimen Description   Final    BLOOD BLOOD LEFT FOREARM Performed at [facility], [address] [place], [place] [address]    Special Requests   Final    BOTTLES DRAWN AEROBIC AND ANAEROBIC Blood Culture adequate volume Performed at [facility], [address] [place], [place] [address]    Culture   Final    NO GROWTH 5 DAYS Performed at [facility], [place] [address], [place], [place] [address]    Report Status 07/07/2020 FINAL  Final     Discharge Instructions:   Discharge Instructions    Call MD for:  difficulty breathing, headache or visual disturbances   Complete by: As directed    Call MD for:  severe uncontrolled pain   Complete by: As directed    Call MD for:  temperature >100.4   Complete by: As directed    Diet general   Complete by: As directed    Discharge instructions   Complete by: As directed    Total isolation duration 21 days from day of positive test.  Follow-up with your primary care physician in 1 to 2 weeks.  No overexertion.  Continue to be physically active.  Complete the course of prednisone. Use oxygen as prescribed. Ok to take over the counter tylenol for mild pain and headache.   Increase activity slowly   Complete by: As directed      Allergies as of 07/07/2020   No Known Allergies     Medication List    STOP taking these medications   HYDROcodone-acetaminophen 5-325 MG tablet Commonly known as: NORCO/VICODIN   senna-docusate 8.6-50 MG tablet Commonly known as: Senokot-S   tamsulosin 0.4 MG Caps capsule Commonly known as: FLOMAX     TAKE these medications   acetaminophen 325 MG tablet Commonly known as: TYLENOL Take 2 tablets (650 mg total) by mouth every 6 (six) hours as needed for mild pain (or Fever >/= 101).   albuterol 108 (90 Base) MCG/ACT inhaler Commonly known as: VENTOLIN HFA Inhale 1 puff into the lungs every 4 (four) hours as needed for shortness of breath.   guaiFENesin-dextromethorphan 100-10 MG/5ML syrup Commonly known as: ROBITUSSIN DM Take 5 mLs by mouth every 4 (four) hours as needed for cough.   multivitamin with minerals tablet Take 1 tablet by mouth daily.   ondansetron 4 MG tablet Commonly known as: ZOFRAN Take  1 tablet (4 mg total) by mouth every 6 (six) hours as  needed for nausea.   predniSONE 10 MG tablet Commonly known as: DELTASONE Take 4 tablets (40 mg total) by mouth daily with breakfast for 5 days.            Durable Medical Equipment  (From admission, onward)         Start     Ordered   07/07/20 1356  For home use only DME oxygen  Once       Question Answer Comment  Length of Need 6 Months   Mode or (Route) Nasal cannula   Liters per Minute 3   Frequency Continuous (stationary and portable oxygen unit needed)   Oxygen conserving device Yes   Oxygen delivery system Gas      07/07/20 1355            Time coordinating discharge: 39 minutes  Signed:  Navpreet Szczygiel  [facility] 07/07/2020, 2:03 PM

## 2020-07-07 NOTE — Care Management Important Message (Signed)
Important Message  Patient Details IM Letter given to the Patient Name: John Houston MRN: [id number] Date of Birth: 1951/05/17   Medicare Important Message Given:  Yes     Kerin Salen 07/07/2020, 9:51 AM

## 2020-07-07 NOTE — Plan of Care (Signed)

## 2020-07-07 NOTE — TOC Transition Note (Signed)
Transition of Care [facility]) - CM/SW Discharge Note   Patient Details  Name: John Houston MRN: [id number] Date of Birth: 15-Mar-1951  [facility] of [facility] CM/SW Contact:  Trish Mage, LCSW Phone Number: 07/07/2020, 2:08 PM   Clinical Narrative:  Patient who is scheduled for D/C today is in need of home O2, has a ride.SAT note and orders seen and appreciated. Contacted Zach with [employer] who will arrange for delivery of travel cannister to room, home unit to home.  No further needs identified.  TOC sign off.     Final next level of care: Home/Self Care Barriers to Discharge: No Barriers Identified   Patient Goals and CMS Choice        Discharge Placement                       Discharge Plan and Services                                     Social Determinants of Health (SDOH) Interventions     Readmission Risk Interventions No flowsheet data found.

## 2020-07-14 DIAGNOSIS — J9601 Acute respiratory failure with hypoxia: Secondary | ICD-10-CM | POA: Diagnosis not present

## 2020-07-14 DIAGNOSIS — D72829 Elevated white blood cell count, unspecified: Secondary | ICD-10-CM | POA: Diagnosis not present

## 2020-07-14 DIAGNOSIS — J1282 Pneumonia due to coronavirus disease 2019: Secondary | ICD-10-CM | POA: Diagnosis not present

## 2020-07-14 DIAGNOSIS — R7989 Other specified abnormal findings of blood chemistry: Secondary | ICD-10-CM | POA: Diagnosis not present

## 2020-07-14 DIAGNOSIS — U071 COVID-19: Secondary | ICD-10-CM | POA: Diagnosis not present

## 2020-07-14 DIAGNOSIS — E785 Hyperlipidemia, unspecified: Secondary | ICD-10-CM | POA: Diagnosis not present

## 2020-07-22 DIAGNOSIS — U071 COVID-19: Secondary | ICD-10-CM | POA: Diagnosis not present

## 2020-07-22 DIAGNOSIS — J1282 Pneumonia due to coronavirus disease 2019: Secondary | ICD-10-CM | POA: Diagnosis not present

## 2020-08-11 DIAGNOSIS — U071 COVID-19: Secondary | ICD-10-CM | POA: Diagnosis not present

## 2020-08-11 DIAGNOSIS — R7989 Other specified abnormal findings of blood chemistry: Secondary | ICD-10-CM | POA: Diagnosis not present

## 2020-08-11 DIAGNOSIS — D72829 Elevated white blood cell count, unspecified: Secondary | ICD-10-CM | POA: Diagnosis not present

## 2020-08-11 DIAGNOSIS — J1282 Pneumonia due to coronavirus disease 2019: Secondary | ICD-10-CM | POA: Diagnosis not present

## 2020-08-11 DIAGNOSIS — J9601 Acute respiratory failure with hypoxia: Secondary | ICD-10-CM | POA: Diagnosis not present

## 2020-08-21 DIAGNOSIS — J1282 Pneumonia due to coronavirus disease 2019: Secondary | ICD-10-CM | POA: Diagnosis not present

## 2020-08-21 DIAGNOSIS — U071 COVID-19: Secondary | ICD-10-CM | POA: Diagnosis not present

## 2020-09-21 DIAGNOSIS — U071 COVID-19: Secondary | ICD-10-CM | POA: Diagnosis not present

## 2020-09-21 DIAGNOSIS — J1282 Pneumonia due to coronavirus disease 2019: Secondary | ICD-10-CM | POA: Diagnosis not present

## 2020-10-22 DIAGNOSIS — U071 COVID-19: Secondary | ICD-10-CM | POA: Diagnosis not present

## 2020-10-22 DIAGNOSIS — J1282 Pneumonia due to coronavirus disease 2019: Secondary | ICD-10-CM | POA: Diagnosis not present

## 2020-11-19 DIAGNOSIS — J1282 Pneumonia due to coronavirus disease 2019: Secondary | ICD-10-CM | POA: Diagnosis not present

## 2020-11-19 DIAGNOSIS — U071 COVID-19: Secondary | ICD-10-CM | POA: Diagnosis not present

## 2020-12-20 DIAGNOSIS — J1282 Pneumonia due to coronavirus disease 2019: Secondary | ICD-10-CM | POA: Diagnosis not present

## 2020-12-20 DIAGNOSIS — U071 COVID-19: Secondary | ICD-10-CM | POA: Diagnosis not present

## 2021-01-13 DIAGNOSIS — M109 Gout, unspecified: Secondary | ICD-10-CM | POA: Diagnosis not present

## 2021-01-13 DIAGNOSIS — Z125 Encounter for screening for malignant neoplasm of prostate: Secondary | ICD-10-CM | POA: Diagnosis not present

## 2021-01-13 DIAGNOSIS — R7301 Impaired fasting glucose: Secondary | ICD-10-CM | POA: Diagnosis not present

## 2021-01-13 DIAGNOSIS — E785 Hyperlipidemia, unspecified: Secondary | ICD-10-CM | POA: Diagnosis not present

## 2021-01-20 DIAGNOSIS — R7301 Impaired fasting glucose: Secondary | ICD-10-CM | POA: Diagnosis not present

## 2021-01-20 DIAGNOSIS — E785 Hyperlipidemia, unspecified: Secondary | ICD-10-CM | POA: Diagnosis not present

## 2021-01-20 DIAGNOSIS — N401 Enlarged prostate with lower urinary tract symptoms: Secondary | ICD-10-CM | POA: Diagnosis not present

## 2021-01-20 DIAGNOSIS — Z0000 Encounter for general adult medical examination without abnormal findings: Secondary | ICD-10-CM | POA: Diagnosis not present

## 2021-01-20 DIAGNOSIS — I2510 Atherosclerotic heart disease of native coronary artery without angina pectoris: Secondary | ICD-10-CM | POA: Diagnosis not present

## 2021-01-20 DIAGNOSIS — I700 Atherosclerosis of aorta: Secondary | ICD-10-CM | POA: Diagnosis not present

## 2021-01-20 DIAGNOSIS — R030 Elevated blood-pressure reading, without diagnosis of hypertension: Secondary | ICD-10-CM | POA: Diagnosis not present

## 2021-01-20 DIAGNOSIS — R82998 Other abnormal findings in urine: Secondary | ICD-10-CM | POA: Diagnosis not present

## 2021-01-20 DIAGNOSIS — M109 Gout, unspecified: Secondary | ICD-10-CM | POA: Diagnosis not present

## 2021-01-21 DIAGNOSIS — K921 Melena: Secondary | ICD-10-CM | POA: Diagnosis not present

## 2021-01-26 DIAGNOSIS — K921 Melena: Secondary | ICD-10-CM | POA: Diagnosis not present

## 2021-03-01 IMAGING — CT CT CARDIAC CORONARY ARTERY CALCIUM SCORE
3 series · 14 of 20 positions shown, 15 images · non-contrast
Comparison: None.
COMPARISON: None.

Addendum:
EXAM:
OVER-READ INTERPRETATION  CT CHEST

The following report is an over-read performed by radiologist Dr.
Teemus Salom [REDACTED] on 01/25/2020. This over-read
does not include interpretation of cardiac or coronary anatomy or
pathology. The coronary calcium score/coronary CTA interpretation by
the cardiologist is attached.
CLINICAL DATA: Risk stratification
Coronary Calcium Score
TECHNIQUE: The patient was scanned on a Siemens Force scanner. Axial
non-contrast 3 mm slices were carried out through the heart. The
data set was analyzed on a dedicated work station and scored using
the Agatson method.

[Series 2: casc 3.0 bv41 2 bestdiast 68 % · axial · 0.36mm/px · z∈[-224,-143]mm · 4 of 45 slices shown, 5 images]
[im 9/45  vessel]
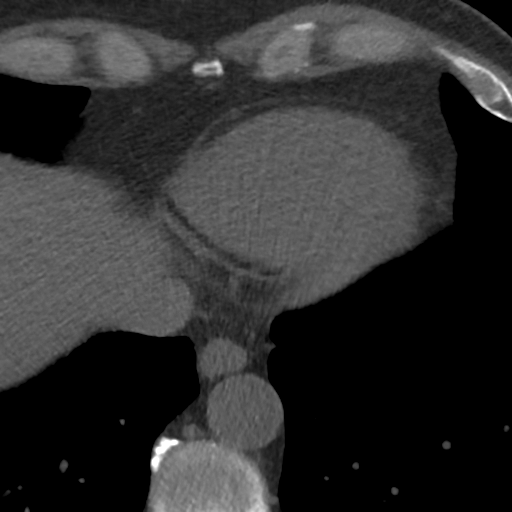
[im 9/45  lung]
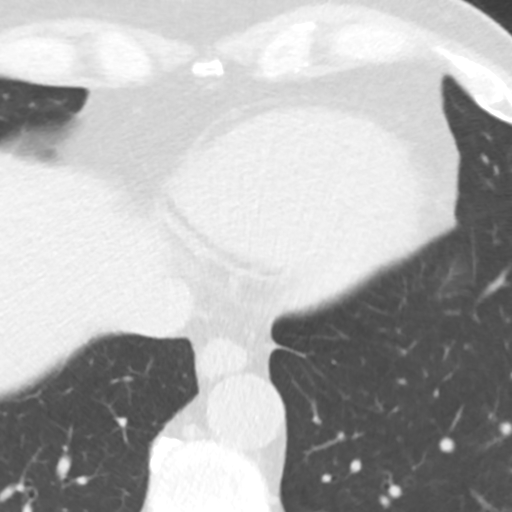
[im 18/45  vessel]
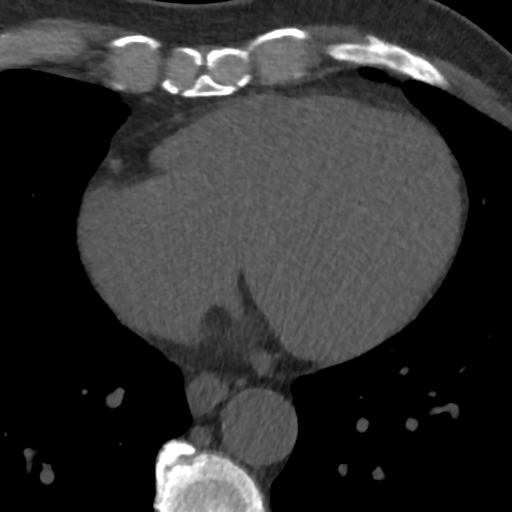
[im 27/45  vessel]
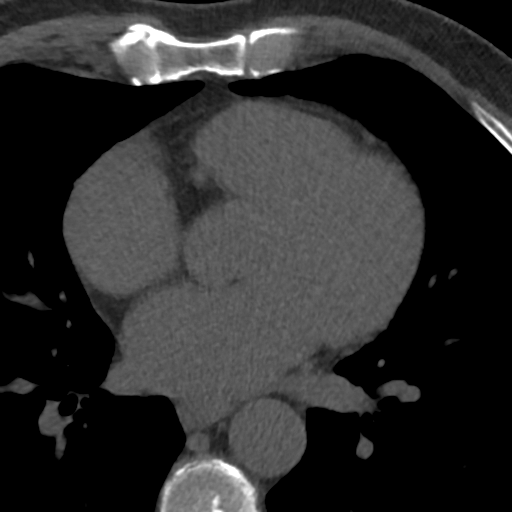
[im 36/45  vessel]
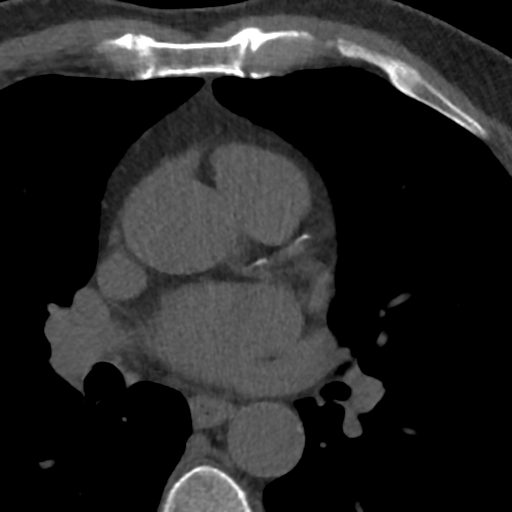

[Series 3: lung 70 % · axial · 0.74mm/px · z∈[-227,-137]mm · 5 of 46 slices shown]
[im 8/46  lung]
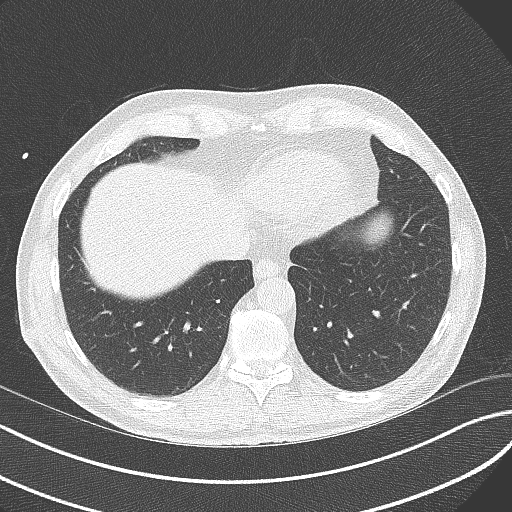
[im 16/46  lung]
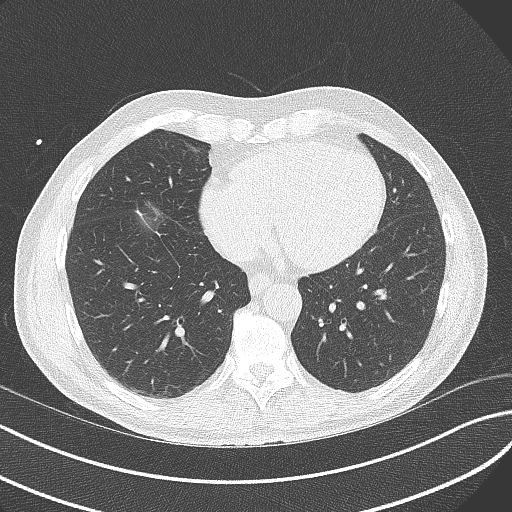
[im 23/46  lung]
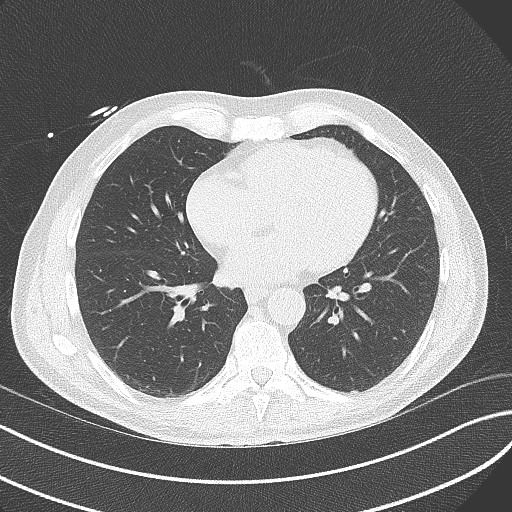
[im 31/46  lung]
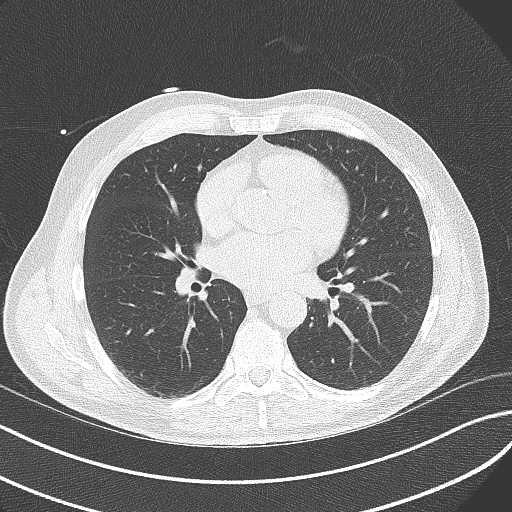
[im 38/46  lung]
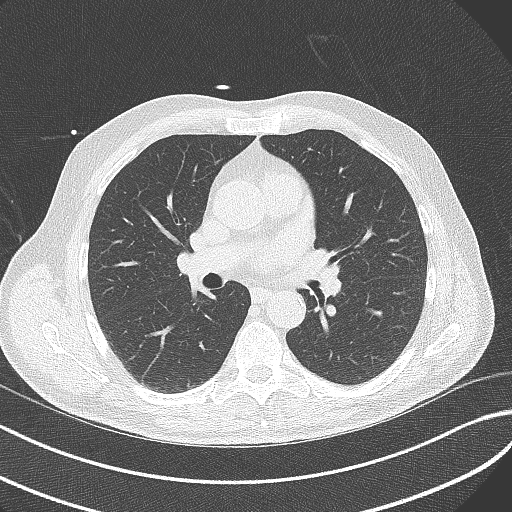

[Series 4: lung st 70 % · axial · 0.74mm/px · z∈[-227,-137]mm · 5 of 46 slices shown]
[im 8/46  lung]
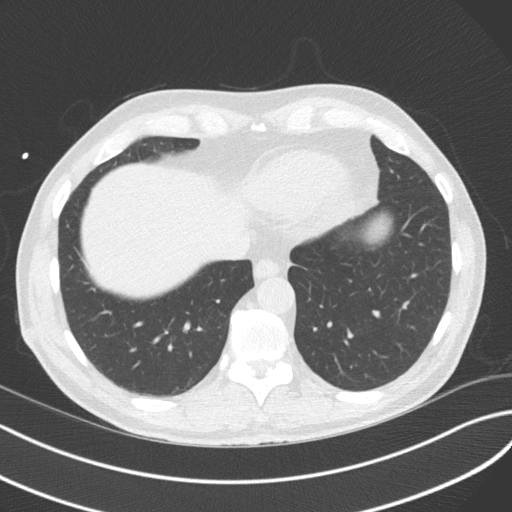
[im 16/46  lung]
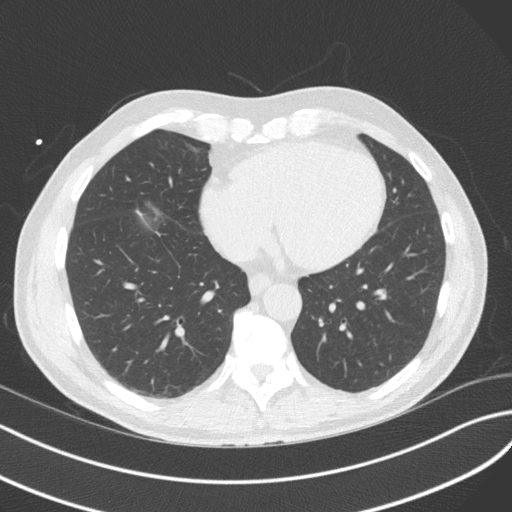
[im 23/46  lung]
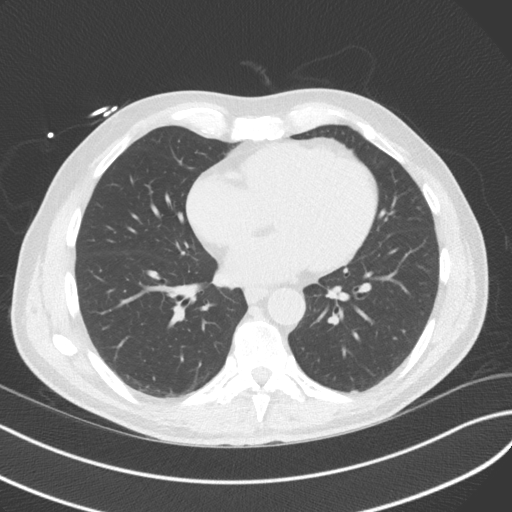
[im 31/46  lung]
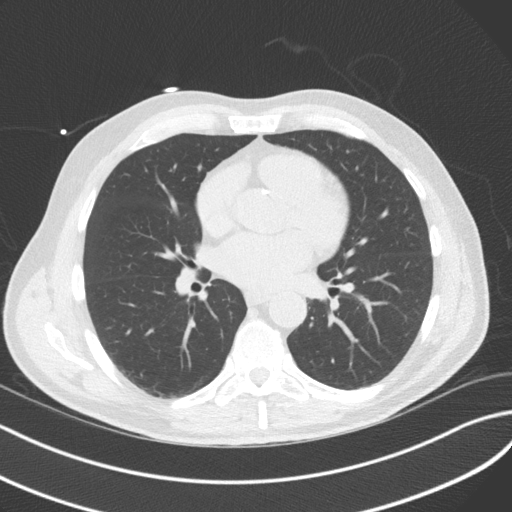
[im 38/46  lung]
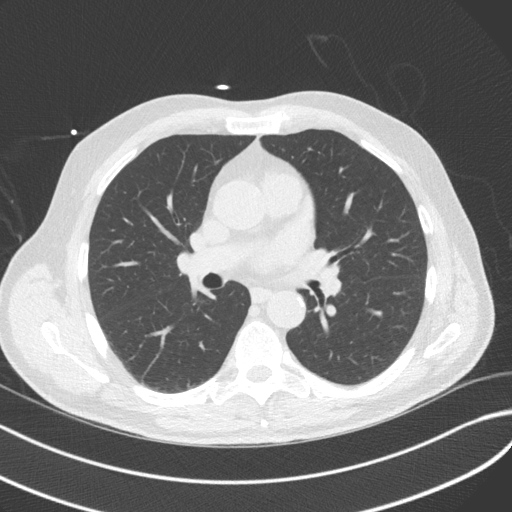

[14 of 20 positions shown; findings below may reference images not displayed]

FINDINGS: Vascular: Aortic atherosclerosis.

Mediastinum/Nodes: No imaged thoracic adenopathy.

Lungs/Pleura: No pleural fluid.  Clear imaged lungs.

Upper Abdomen: Normal imaged portions of the liver, spleen, stomach.

Musculoskeletal: No acute osseous abnormality. Lower thoracic
spondylosis.
IMPRESSION: 1.  No acute findings in the imaged extracardiac chest.
2.  Aortic Atherosclerosis (Q35FR-RLD.D).
FINDINGS: Non-cardiac: See separate report from [REDACTED].

Ascending Aorta: Normal size.  Mild diffuse calcifications.

Pericardium: Normal.

Coronary arteries: Normal origin.
IMPRESSION: Coronary calcium score of 68. This was 44 percentile for age and sex
matched control.

*** End of Addendum ***
EXAM:
OVER-READ INTERPRETATION  CT CHEST

The following report is an over-read performed by radiologist Dr.
Teemus Salom [REDACTED] on 01/25/2020. This over-read
does not include interpretation of cardiac or coronary anatomy or
pathology. The coronary calcium score/coronary CTA interpretation by
the cardiologist is attached.
FINDINGS: Vascular: Aortic atherosclerosis.

Mediastinum/Nodes: No imaged thoracic adenopathy.

Lungs/Pleura: No pleural fluid.  Clear imaged lungs.

Upper Abdomen: Normal imaged portions of the liver, spleen, stomach.

Musculoskeletal: No acute osseous abnormality. Lower thoracic
spondylosis.
IMPRESSION: 1.  No acute findings in the imaged extracardiac chest.
2.  Aortic Atherosclerosis (Q35FR-RLD.D).

## 2021-04-06 DIAGNOSIS — Z1212 Encounter for screening for malignant neoplasm of rectum: Secondary | ICD-10-CM | POA: Diagnosis not present

## 2021-04-06 DIAGNOSIS — Z1211 Encounter for screening for malignant neoplasm of colon: Secondary | ICD-10-CM | POA: Diagnosis not present

## 2021-09-17 DIAGNOSIS — L03313 Cellulitis of chest wall: Secondary | ICD-10-CM | POA: Diagnosis not present

## 2021-09-17 DIAGNOSIS — L0291 Cutaneous abscess, unspecified: Secondary | ICD-10-CM | POA: Diagnosis not present

## 2021-09-18 DIAGNOSIS — L0889 Other specified local infections of the skin and subcutaneous tissue: Secondary | ICD-10-CM | POA: Diagnosis not present

## 2021-09-18 DIAGNOSIS — L723 Sebaceous cyst: Secondary | ICD-10-CM | POA: Diagnosis not present

## 2021-12-31 DIAGNOSIS — J069 Acute upper respiratory infection, unspecified: Secondary | ICD-10-CM | POA: Diagnosis not present

## 2021-12-31 DIAGNOSIS — R0981 Nasal congestion: Secondary | ICD-10-CM | POA: Diagnosis not present

## 2021-12-31 DIAGNOSIS — Z1152 Encounter for screening for COVID-19: Secondary | ICD-10-CM | POA: Diagnosis not present

## 2021-12-31 DIAGNOSIS — R5383 Other fatigue: Secondary | ICD-10-CM | POA: Diagnosis not present

## 2021-12-31 DIAGNOSIS — R058 Other specified cough: Secondary | ICD-10-CM | POA: Diagnosis not present

## 2022-02-16 DIAGNOSIS — R7301 Impaired fasting glucose: Secondary | ICD-10-CM | POA: Diagnosis not present

## 2022-02-16 DIAGNOSIS — Z125 Encounter for screening for malignant neoplasm of prostate: Secondary | ICD-10-CM | POA: Diagnosis not present

## 2022-02-16 DIAGNOSIS — R7989 Other specified abnormal findings of blood chemistry: Secondary | ICD-10-CM | POA: Diagnosis not present

## 2022-02-16 DIAGNOSIS — E785 Hyperlipidemia, unspecified: Secondary | ICD-10-CM | POA: Diagnosis not present

## 2022-02-23 DIAGNOSIS — I2510 Atherosclerotic heart disease of native coronary artery without angina pectoris: Secondary | ICD-10-CM | POA: Diagnosis not present

## 2022-02-23 DIAGNOSIS — Z1339 Encounter for screening examination for other mental health and behavioral disorders: Secondary | ICD-10-CM | POA: Diagnosis not present

## 2022-02-23 DIAGNOSIS — M109 Gout, unspecified: Secondary | ICD-10-CM | POA: Diagnosis not present

## 2022-02-23 DIAGNOSIS — Z1331 Encounter for screening for depression: Secondary | ICD-10-CM | POA: Diagnosis not present

## 2022-02-23 DIAGNOSIS — R82998 Other abnormal findings in urine: Secondary | ICD-10-CM | POA: Diagnosis not present

## 2022-02-23 DIAGNOSIS — M542 Cervicalgia: Secondary | ICD-10-CM | POA: Diagnosis not present

## 2022-02-23 DIAGNOSIS — R7301 Impaired fasting glucose: Secondary | ICD-10-CM | POA: Diagnosis not present

## 2022-02-23 DIAGNOSIS — N401 Enlarged prostate with lower urinary tract symptoms: Secondary | ICD-10-CM | POA: Diagnosis not present

## 2022-02-23 DIAGNOSIS — Z0000 Encounter for general adult medical examination without abnormal findings: Secondary | ICD-10-CM | POA: Diagnosis not present

## 2022-02-23 DIAGNOSIS — E785 Hyperlipidemia, unspecified: Secondary | ICD-10-CM | POA: Diagnosis not present

## 2022-03-03 DIAGNOSIS — M542 Cervicalgia: Secondary | ICD-10-CM | POA: Diagnosis not present

## 2022-03-11 DIAGNOSIS — M542 Cervicalgia: Secondary | ICD-10-CM | POA: Diagnosis not present

## 2022-03-15 DIAGNOSIS — L239 Allergic contact dermatitis, unspecified cause: Secondary | ICD-10-CM | POA: Diagnosis not present

## 2022-03-15 DIAGNOSIS — T63441A Toxic effect of venom of bees, accidental (unintentional), initial encounter: Secondary | ICD-10-CM | POA: Diagnosis not present

## 2022-04-28 DIAGNOSIS — M5416 Radiculopathy, lumbar region: Secondary | ICD-10-CM | POA: Diagnosis not present

## 2022-04-28 DIAGNOSIS — M48061 Spinal stenosis, lumbar region without neurogenic claudication: Secondary | ICD-10-CM | POA: Diagnosis not present

## 2022-05-07 ENCOUNTER — Other Ambulatory Visit: Payer: Self-pay | Admitting: Internal Medicine

## 2022-05-07 DIAGNOSIS — M5416 Radiculopathy, lumbar region: Secondary | ICD-10-CM

## 2022-05-07 DIAGNOSIS — M48061 Spinal stenosis, lumbar region without neurogenic claudication: Secondary | ICD-10-CM

## 2022-05-12 ENCOUNTER — Ambulatory Visit
Admission: RE | Admit: 2022-05-12 | Discharge: 2022-05-12 | Disposition: A | Discharge status: Home / Self Care | Payer: PPO | Source: Ambulatory Visit | Attending: Internal Medicine | Admitting: Internal Medicine

## 2022-05-12 DIAGNOSIS — M5416 Radiculopathy, lumbar region: Secondary | ICD-10-CM

## 2022-05-12 DIAGNOSIS — M5136 Other intervertebral disc degeneration, lumbar region: Secondary | ICD-10-CM | POA: Diagnosis not present

## 2022-05-12 DIAGNOSIS — M545 Low back pain, unspecified: Secondary | ICD-10-CM | POA: Diagnosis not present

## 2022-05-12 DIAGNOSIS — M4316 Spondylolisthesis, lumbar region: Secondary | ICD-10-CM | POA: Diagnosis not present

## 2022-05-12 DIAGNOSIS — M48061 Spinal stenosis, lumbar region without neurogenic claudication: Secondary | ICD-10-CM | POA: Diagnosis not present

## 2022-05-12 DIAGNOSIS — M47816 Spondylosis without myelopathy or radiculopathy, lumbar region: Secondary | ICD-10-CM | POA: Diagnosis not present

## 2022-05-26 DIAGNOSIS — Z6825 Body mass index (BMI) 25.0-25.9, adult: Secondary | ICD-10-CM | POA: Diagnosis not present

## 2022-05-26 DIAGNOSIS — M5416 Radiculopathy, lumbar region: Secondary | ICD-10-CM | POA: Diagnosis not present

## 2022-06-11 DIAGNOSIS — M5416 Radiculopathy, lumbar region: Secondary | ICD-10-CM | POA: Diagnosis not present

## 2022-06-11 DIAGNOSIS — M4316 Spondylolisthesis, lumbar region: Secondary | ICD-10-CM | POA: Diagnosis not present

## 2022-06-22 ENCOUNTER — Other Ambulatory Visit: Payer: Self-pay | Admitting: Neurological Surgery

## 2022-06-28 NOTE — Pre-Procedure Instructions (Signed)
Surgical Instructions    Your procedure is scheduled on Tuesday October 31.  Report to [facility] Main Entrance "A" at 5:30 A.M., then check in with the Admitting office.  Call this number if you have problems the morning of surgery:  [phone]  If you have any questions prior to your surgery date call [phone]: Open Monday-Friday 8am-4pm  If you experience any cold or flu symptoms such as cough, fever, chills, shortness of breath, etc. between now and your scheduled surgery, please notify us at the above number     Remember:  Do not eat after midnight the night before your surgery  You may drink clear liquids until 4:30 the morning of your surgery.   Clear liquids allowed are: Water, Non-Citrus Juices (without pulp), Carbonated Beverages, Clear Tea, Black Coffee ONLY (NO MILK, CREAM OR POWDERED CREAMER of any kind), and Gatorade    Take these medicines the morning of surgery with A SIP OF WATER:  allopurinol (ZYLOPRIM) gabapentin (NEURONTIN) Naphazoline-Hydroxyprop Mcell 0.03-0.5 % SOLN rosuvastatin (CRESTOR)   As of today, STOP taking any Aspirin (unless otherwise instructed by your surgeon) Aleve, Naproxen, Ibuprofen, Motrin, Advil, Goody's, BC's, all herbal medications, fish oil, and all vitamins.          Do NOT Smoke (Tobacco/Vaping)  24 hours prior to your procedure  If you use a CPAP at night, you may bring your mask for your overnight stay.   Contacts, glasses, hearing aids, dentures or partials may not be worn into surgery, please bring cases for these belongings   For patients admitted to the hospital, discharge time will be determined by your treatment team.   Patients discharged the day of surgery will not be allowed to drive home, and someone needs to stay with them for 24 hours.   SURGICAL WAITING ROOM VISITATION Patients having surgery or a procedure may have no more than 2 support people in the waiting area - these visitors may rotate.   Children  under the age of 50 must have an adult with them who is not the patient. If the patient needs to stay at the hospital during part of their recovery, the visitor guidelines for inpatient rooms apply. Pre-op nurse will coordinate an appropriate time for 1 support person to accompany patient in pre-op.  This support person may not rotate.   Please refer to the [facility] website for the visitor guidelines for Inpatients (after your surgery is over and you are in a regular room).    Special instructions:    Oral Hygiene is also important to reduce your risk of infection.  Remember - BRUSH YOUR TEETH THE MORNING OF SURGERY WITH YOUR REGULAR TOOTHPASTE   [place]- Preparing For Surgery  Before surgery, you can play an important role. Because skin is not sterile, your skin needs to be as free of germs as possible. You can reduce the number of germs on your skin by washing with CHG (chlorahexidine gluconate) Soap before surgery.  CHG is an antiseptic cleaner which kills germs and bonds with the skin to continue killing germs even after washing.     Please do not use if you have an allergy to CHG or antibacterial soaps. If your skin becomes reddened/irritated stop using the CHG.  Do not shave (including legs and underarms) for at least 48 hours prior to first CHG shower. It is OK to shave your face.  Please follow these instructions carefully.     Shower the [employer] SURGERY and the  MORNING OF SURGERY with CHG Soap.   If you chose to wash your hair, wash your hair first as usual with your normal shampoo. After you shampoo, rinse your hair and body thoroughly to remove the shampoo.  Then [employer] and genitals (private parts) with your normal soap and rinse thoroughly to remove soap.  After that Use CHG Soap as you would any other liquid soap. You can apply CHG directly to the skin and wash gently with a scrungie or a clean washcloth.   Apply the CHG Soap to your body ONLY FROM THE NECK  DOWN.  Do not use on open wounds or open sores. Avoid contact with your eyes, ears, mouth and genitals (private parts). Wash Face and genitals (private parts)  with your normal soap.   Wash thoroughly, paying special attention to the area where your surgery will be performed.  Thoroughly rinse your body with warm water from the neck down.  DO NOT shower/wash with your normal soap after using and rinsing off the CHG Soap.  Pat yourself dry with a CLEAN TOWEL.  Wear CLEAN PAJAMAS to bed the night before surgery  Place CLEAN SHEETS on your bed the night before your surgery  DO NOT SLEEP WITH PETS.   Day of Surgery:  Take a shower with CHG soap. Wear Clean/Comfortable clothing the morning of surgery Remember to brush your teeth WITH YOUR REGULAR TOOTHPASTE. Do not wear jewelry. Do not wear lotions, powders, cologne or deodorant. Men may shave face and neck. Do not bring valuables to the hospital.  [facility] is not responsible for any belongings or valuables.    If you received a COVID test during your pre-op visit, it is requested that you wear a mask when out in public, stay away from anyone that may not be feeling well, and notify your surgeon if you develop symptoms. If you have been in contact with anyone that has tested positive in the last 10 days, please notify your surgeon.    Please read over the following fact sheets that you were given.

## 2022-06-29 ENCOUNTER — Encounter (HOSPITAL_COMMUNITY)
Admission: RE | Admit: 2022-06-29 | Discharge: 2022-06-29 | Disposition: A | Discharge status: Home / Self Care | Payer: PPO | Source: Ambulatory Visit | Attending: Neurological Surgery | Admitting: Neurological Surgery

## 2022-06-29 ENCOUNTER — Encounter (HOSPITAL_COMMUNITY): Payer: Self-pay

## 2022-06-29 ENCOUNTER — Other Ambulatory Visit: Payer: Self-pay

## 2022-06-29 VITALS — BP 155/93 | HR 80 | Temp 98.6°F | Resp 18 | Ht 69.0 in | Wt 181.2 lb

## 2022-06-29 DIAGNOSIS — Z01812 Encounter for preprocedural laboratory examination: Secondary | ICD-10-CM | POA: Diagnosis not present

## 2022-06-29 DIAGNOSIS — B9562 Methicillin resistant Staphylococcus aureus infection as the cause of diseases classified elsewhere: Secondary | ICD-10-CM

## 2022-06-29 DIAGNOSIS — Z01818 Encounter for other preprocedural examination: Secondary | ICD-10-CM

## 2022-06-29 LAB — CBC
HCT: 42.5 % (ref 39.0–52.0)
Hemoglobin: 14.9 g/dL (ref 13.0–17.0)
MCH: 33.9 pg (ref 26.0–34.0)
MCHC: 35.1 g/dL (ref 30.0–36.0)
MCV: 96.6 fL (ref 80.0–100.0)
Platelets: 173 10*3/uL (ref 150–400)
RBC: 4.4 MIL/uL (ref 4.22–5.81)
RDW: 12.5 % (ref 11.5–15.5)
WBC: 8.1 10*3/uL (ref 4.0–10.5)
nRBC: 0 % (ref 0.0–0.2)

## 2022-06-29 LAB — TYPE AND SCREEN
ABO/RH(D): A NEG
Antibody Screen: NEGATIVE

## 2022-06-29 LAB — SURGICAL PCR SCREEN
MRSA, PCR: POSITIVE — AB
Staphylococcus aureus: POSITIVE — AB

## 2022-06-29 NOTE — Progress Notes (Signed)
MRSA+ result per micro. Profend DOS, staff message sent to Dr. Zada Finders.

## 2022-06-29 NOTE — Progress Notes (Signed)
[place] [place] patient to be sure he was still coming in to his appt.  He stated he was signing in now.

## 2022-06-29 NOTE — Progress Notes (Signed)
PCP - Crist Infante Cardiologist -Denies   PPM/ICD - Denies Device Orders -  Rep Notified -   Chest x-ray - NI EKG - NI Stress Test - Denies ECHO - Denies Cardiac Cath - Denies  Sleep Study - Denies  DM - Denies   ERAS Protcol - Yes  COVID TEST- NI   Anesthesia review: No  Patient denies shortness of breath, fever, cough and chest pain at PAT appointment   All instructions explained to the patient, with a verbal understanding of the material. Patient agrees to go over the instructions while at home for a better understanding. The opportunity to ask questions was provided.

## 2022-07-05 NOTE — Anesthesia Preprocedure Evaluation (Signed)
Anesthesia Evaluation  Patient identified by MRN, date of birth, ID band Patient awake    Reviewed: Allergy & Precautions, NPO status , Patient's Chart, lab work & pertinent test results  Airway Mallampati: II  TM Distance: >3 FB Neck ROM: Full    Dental  (+) Missing   Pulmonary former smoker   Pulmonary exam normal        Cardiovascular negative cardio ROS Normal cardiovascular exam     Neuro/Psych negative neurological ROS  negative psych ROS   GI/Hepatic negative GI ROS,,,(+)     substance abuse    Endo/Other  negative endocrine ROS    Renal/GU negative Renal ROS     Musculoskeletal negative musculoskeletal ROS (+)    Abdominal   Peds  Hematology negative hematology ROS (+)   Anesthesia Other Findings Spondylolisthesis, Lumbar region  Reproductive/Obstetrics                             Anesthesia Physical Anesthesia Plan  ASA: 2  Anesthesia Plan: General   Post-op Pain Management:    Induction: Intravenous  PONV Risk Score and Plan: 2 and Ondansetron, Dexamethasone, Midazolam and Treatment may vary due to age or medical condition  Airway Management Planned: Oral ETT  Additional Equipment: Arterial line  Intra-op Plan:   Post-operative Plan: Extubation in OR  Informed Consent: I have reviewed the patients History and Physical, chart, labs and discussed the procedure including the risks, benefits and alternatives for the proposed anesthesia with the patient or authorized representative who has indicated his/her understanding and acceptance.     Dental advisory given  Plan Discussed with: CRNA  Anesthesia Plan Comments:         Anesthesia Quick Evaluation

## 2022-07-06 ENCOUNTER — Ambulatory Visit (HOSPITAL_COMMUNITY): Payer: PPO

## 2022-07-06 ENCOUNTER — Other Ambulatory Visit: Payer: Self-pay

## 2022-07-06 ENCOUNTER — Observation Stay (HOSPITAL_COMMUNITY)
Admission: RE | Admit: 2022-07-06 | Discharge: 2022-07-07 | Disposition: A | Discharge status: Home / Self Care | Payer: PPO | Attending: Neurological Surgery | Admitting: Neurological Surgery

## 2022-07-06 ENCOUNTER — Encounter (HOSPITAL_COMMUNITY): Payer: Self-pay | Admitting: Neurological Surgery

## 2022-07-06 ENCOUNTER — Ambulatory Visit (HOSPITAL_BASED_OUTPATIENT_CLINIC_OR_DEPARTMENT_OTHER): Payer: PPO | Admitting: Certified Registered"

## 2022-07-06 ENCOUNTER — Ambulatory Visit (HOSPITAL_COMMUNITY): Payer: PPO | Admitting: Certified Registered"

## 2022-07-06 ENCOUNTER — Encounter (HOSPITAL_COMMUNITY)
Admission: RE | Disposition: A | Discharge status: Home / Self Care | Payer: Self-pay | Source: Home / Self Care | Attending: Neurological Surgery

## 2022-07-06 DIAGNOSIS — M4316 Spondylolisthesis, lumbar region: Secondary | ICD-10-CM | POA: Diagnosis not present

## 2022-07-06 DIAGNOSIS — Z87891 Personal history of nicotine dependence: Secondary | ICD-10-CM | POA: Insufficient documentation

## 2022-07-06 DIAGNOSIS — M5416 Radiculopathy, lumbar region: Secondary | ICD-10-CM

## 2022-07-06 DIAGNOSIS — Z8616 Personal history of COVID-19: Secondary | ICD-10-CM | POA: Diagnosis not present

## 2022-07-06 DIAGNOSIS — M48061 Spinal stenosis, lumbar region without neurogenic claudication: Secondary | ICD-10-CM | POA: Diagnosis not present

## 2022-07-06 DIAGNOSIS — Z981 Arthrodesis status: Secondary | ICD-10-CM | POA: Diagnosis not present

## 2022-07-06 HISTORY — PX: TRANSFORAMINAL LUMBAR INTERBODY FUSION (TLIF) WITH PEDICLE SCREW FIXATION 1 LEVEL: SHX6141

## 2022-07-06 LAB — ABO/RH: ABO/RH(D): A NEG

## 2022-07-06 SURGERY — TRANSFORAMINAL LUMBAR INTERBODY FUSION (TLIF) WITH PEDICLE SCREW FIXATION 1 LEVEL
Anesthesia: General

## 2022-07-06 MED ORDER — HYDROMORPHONE HCL 1 MG/ML IJ SOLN: 1.0000 mg | INTRAMUSCULAR | Status: DC | PRN

## 2022-07-06 MED ORDER — DEXAMETHASONE SODIUM PHOSPHATE 10 MG/ML IJ SOLN
INTRAMUSCULAR | Status: DC | PRN
  Administered 2022-07-06: 10 mg via INTRAVENOUS

## 2022-07-06 MED ORDER — SUGAMMADEX SODIUM 200 MG/2ML IV SOLN
INTRAVENOUS | Status: DC | PRN
  Administered 2022-07-06: 200 mg via INTRAVENOUS

## 2022-07-06 MED ORDER — VANCOMYCIN HCL IN DEXTROSE 1-5 GM/200ML-% IV SOLN
INTRAVENOUS | Status: AC
  Administered 2022-07-06: 1000 mg via INTRAVENOUS
  Filled 2022-07-06: qty 200

## 2022-07-06 MED ORDER — BUPIVACAINE HCL (PF) 0.5 % IJ SOLN
INTRAMUSCULAR | Status: DC | PRN
  Administered 2022-07-06: 5 mL

## 2022-07-06 MED ORDER — THROMBIN 5000 UNITS EX SOLR
OROMUCOSAL | Status: DC | PRN
  Administered 2022-07-06: 5 mL via TOPICAL

## 2022-07-06 MED ORDER — ACETAMINOPHEN 500 MG PO TABS
1000.0000 mg | ORAL_TABLET | Freq: Once | ORAL | Status: AC
Start: 2022-07-06 — End: 2022-07-06

## 2022-07-06 MED ORDER — PHENOL 1.4 % MT LIQD: 1.0000 | OROMUCOSAL | Status: DC | PRN

## 2022-07-06 MED ORDER — 0.9 % SODIUM CHLORIDE (POUR BTL) OPTIME
TOPICAL | Status: DC | PRN
  Administered 2022-07-06: 1000 mL

## 2022-07-06 MED ORDER — ALLOPURINOL 300 MG PO TABS
150.0000 mg | ORAL_TABLET | Freq: Every morning | ORAL | Status: DC
  Administered 2022-07-07: 150 mg via ORAL
  Filled 2022-07-06: qty 1

## 2022-07-06 MED ORDER — BUPIVACAINE HCL (PF) 0.5 % IJ SOLN
INTRAMUSCULAR | Status: AC
  Filled 2022-07-06: qty 30

## 2022-07-06 MED ORDER — CYCLOBENZAPRINE HCL 10 MG PO TABS
10.0000 mg | ORAL_TABLET | Freq: Three times a day (TID) | ORAL | Status: DC | PRN
  Administered 2022-07-06 – 2022-07-07 (×2): 10 mg via ORAL
  Filled 2022-07-06 (×2): qty 1

## 2022-07-06 MED ORDER — CHLORHEXIDINE GLUCONATE CLOTH 2 % EX PADS: 6.0000 | MEDICATED_PAD | Freq: Once | CUTANEOUS | Status: DC

## 2022-07-06 MED ORDER — PROPOFOL 10 MG/ML IV BOLUS
INTRAVENOUS | Status: AC
  Filled 2022-07-06: qty 20

## 2022-07-06 MED ORDER — ROCURONIUM BROMIDE 10 MG/ML (PF) SYRINGE
PREFILLED_SYRINGE | INTRAVENOUS | Status: DC | PRN
  Administered 2022-07-06: 70 mg via INTRAVENOUS
  Administered 2022-07-06: 20 mg via INTRAVENOUS
  Administered 2022-07-06: 30 mg via INTRAVENOUS
  Administered 2022-07-06: 20 mg via INTRAVENOUS

## 2022-07-06 MED ORDER — SODIUM CHLORIDE 0.9% FLUSH
3.0000 mL | Freq: Two times a day (BID) | INTRAVENOUS | Status: DC
  Administered 2022-07-06 (×2): 3 mL via INTRAVENOUS

## 2022-07-06 MED ORDER — LIDOCAINE-EPINEPHRINE 1 %-1:100000 IJ SOLN
INTRAMUSCULAR | Status: AC
  Filled 2022-07-06: qty 1

## 2022-07-06 MED ORDER — ORAL CARE MOUTH RINSE: 15.0000 mL | Freq: Once | OROMUCOSAL | Status: AC

## 2022-07-06 MED ORDER — LACTATED RINGERS IV SOLN: INTRAVENOUS | Status: DC

## 2022-07-06 MED ORDER — LIDOCAINE-EPINEPHRINE 1 %-1:100000 IJ SOLN
INTRAMUSCULAR | Status: DC | PRN
  Administered 2022-07-06: 5 mL

## 2022-07-06 MED ORDER — ACETAMINOPHEN 500 MG PO TABS
ORAL_TABLET | ORAL | Status: AC
  Administered 2022-07-06: 1000 mg via ORAL
  Filled 2022-07-06: qty 2

## 2022-07-06 MED ORDER — FENTANYL CITRATE (PF) 100 MCG/2ML IJ SOLN
25.0000 ug | INTRAMUSCULAR | Status: DC | PRN
  Administered 2022-07-06: 50 ug via INTRAVENOUS

## 2022-07-06 MED ORDER — ONDANSETRON HCL 4 MG/2ML IJ SOLN
INTRAMUSCULAR | Status: DC | PRN
  Administered 2022-07-06: 4 mg via INTRAVENOUS

## 2022-07-06 MED ORDER — MENTHOL 3 MG MT LOZG: 1.0000 | LOZENGE | OROMUCOSAL | Status: DC | PRN

## 2022-07-06 MED ORDER — CHLORHEXIDINE GLUCONATE 0.12 % MT SOLN: 15.0000 mL | Freq: Once | OROMUCOSAL | Status: AC

## 2022-07-06 MED ORDER — FENTANYL CITRATE (PF) 250 MCG/5ML IJ SOLN
INTRAMUSCULAR | Status: AC
  Filled 2022-07-06: qty 5

## 2022-07-06 MED ORDER — ACETAMINOPHEN 325 MG PO TABS
650.0000 mg | ORAL_TABLET | ORAL | Status: DC | PRN
  Filled 2022-07-06: qty 2

## 2022-07-06 MED ORDER — CEFAZOLIN SODIUM-DEXTROSE 2-4 GM/100ML-% IV SOLN
2.0000 g | INTRAVENOUS | Status: AC
  Administered 2022-07-06: 2 g via INTRAVENOUS
  Filled 2022-07-06: qty 100

## 2022-07-06 MED ORDER — SODIUM CHLORIDE 0.9 % IV SOLN
250.0000 mL | INTRAVENOUS | Status: DC
  Administered 2022-07-06: 250 mL via INTRAVENOUS

## 2022-07-06 MED ORDER — SODIUM CHLORIDE 0.9% FLUSH: 3.0000 mL | INTRAVENOUS | Status: DC | PRN

## 2022-07-06 MED ORDER — FENTANYL CITRATE (PF) 250 MCG/5ML IJ SOLN
INTRAMUSCULAR | Status: DC | PRN
  Administered 2022-07-06 (×3): 50 ug via INTRAVENOUS
  Administered 2022-07-06: 100 ug via INTRAVENOUS

## 2022-07-06 MED ORDER — ACETAMINOPHEN 650 MG RE SUPP: 650.0000 mg | RECTAL | Status: DC | PRN

## 2022-07-06 MED ORDER — VANCOMYCIN HCL IN DEXTROSE 1-5 GM/200ML-% IV SOLN
1000.0000 mg | INTRAVENOUS | Status: AC
  Filled 2022-07-06: qty 200

## 2022-07-06 MED ORDER — OXYCODONE HCL 5 MG PO TABS: 5.0000 mg | ORAL_TABLET | ORAL | Status: DC | PRN

## 2022-07-06 MED ORDER — POLYETHYLENE GLYCOL 3350 17 G PO PACK: 17.0000 g | PACK | Freq: Every day | ORAL | Status: DC | PRN

## 2022-07-06 MED ORDER — NAPHAZOLINE-GLYCERIN 0.012-0.25 % OP SOLN
1.0000 [drp] | Freq: Every day | OPHTHALMIC | Status: DC
  Filled 2022-07-06: qty 15

## 2022-07-06 MED ORDER — DOCUSATE SODIUM 100 MG PO CAPS
100.0000 mg | ORAL_CAPSULE | Freq: Two times a day (BID) | ORAL | Status: DC
  Administered 2022-07-06 (×2): 100 mg via ORAL
  Filled 2022-07-06 (×2): qty 1

## 2022-07-06 MED ORDER — ONDANSETRON HCL 4 MG/2ML IJ SOLN: 4.0000 mg | Freq: Once | INTRAMUSCULAR | Status: DC | PRN

## 2022-07-06 MED ORDER — FENTANYL CITRATE (PF) 100 MCG/2ML IJ SOLN
INTRAMUSCULAR | Status: AC
  Filled 2022-07-06: qty 2

## 2022-07-06 MED ORDER — ROCURONIUM BROMIDE 10 MG/ML (PF) SYRINGE
PREFILLED_SYRINGE | INTRAVENOUS | Status: AC
  Filled 2022-07-06: qty 10

## 2022-07-06 MED ORDER — CHLORHEXIDINE GLUCONATE 0.12 % MT SOLN
OROMUCOSAL | Status: AC
  Administered 2022-07-06: 15 mL via OROMUCOSAL
  Filled 2022-07-06: qty 15

## 2022-07-06 MED ORDER — THROMBIN 5000 UNITS EX SOLR
CUTANEOUS | Status: AC
  Filled 2022-07-06: qty 5000

## 2022-07-06 MED ORDER — ONDANSETRON HCL 4 MG/2ML IJ SOLN: 4.0000 mg | Freq: Four times a day (QID) | INTRAMUSCULAR | Status: DC | PRN

## 2022-07-06 MED ORDER — AMISULPRIDE (ANTIEMETIC) 5 MG/2ML IV SOLN: 10.0000 mg | Freq: Once | INTRAVENOUS | Status: DC | PRN

## 2022-07-06 MED ORDER — CEFAZOLIN SODIUM-DEXTROSE 2-4 GM/100ML-% IV SOLN
2.0000 g | Freq: Three times a day (TID) | INTRAVENOUS | Status: AC
  Administered 2022-07-06 (×2): 2 g via INTRAVENOUS
  Filled 2022-07-06 (×2): qty 100

## 2022-07-06 MED ORDER — GABAPENTIN 300 MG PO CAPS
300.0000 mg | ORAL_CAPSULE | Freq: Three times a day (TID) | ORAL | Status: DC
  Administered 2022-07-06 (×2): 300 mg via ORAL
  Filled 2022-07-06 (×2): qty 1

## 2022-07-06 MED ORDER — ROSUVASTATIN CALCIUM 5 MG PO TABS
10.0000 mg | ORAL_TABLET | Freq: Every morning | ORAL | Status: DC
  Administered 2022-07-07: 10 mg via ORAL
  Filled 2022-07-06: qty 2

## 2022-07-06 MED ORDER — LIDOCAINE 2% (20 MG/ML) 5 ML SYRINGE
INTRAMUSCULAR | Status: AC
  Filled 2022-07-06: qty 5

## 2022-07-06 MED ORDER — DEXAMETHASONE SODIUM PHOSPHATE 10 MG/ML IJ SOLN
INTRAMUSCULAR | Status: AC
  Filled 2022-07-06: qty 1

## 2022-07-06 MED ORDER — LIDOCAINE 2% (20 MG/ML) 5 ML SYRINGE
INTRAMUSCULAR | Status: DC | PRN
  Administered 2022-07-06: 80 mg via INTRAVENOUS

## 2022-07-06 MED ORDER — ONDANSETRON HCL 4 MG/2ML IJ SOLN
INTRAMUSCULAR | Status: AC
  Filled 2022-07-06: qty 2

## 2022-07-06 MED ORDER — PHENYLEPHRINE 80 MCG/ML (10ML) SYRINGE FOR IV PUSH (FOR BLOOD PRESSURE SUPPORT)
PREFILLED_SYRINGE | INTRAVENOUS | Status: DC | PRN
  Administered 2022-07-06: 80 ug via INTRAVENOUS
  Administered 2022-07-06: 160 ug via INTRAVENOUS
  Administered 2022-07-06 (×5): 80 ug via INTRAVENOUS

## 2022-07-06 MED ORDER — ONDANSETRON HCL 4 MG PO TABS: 4.0000 mg | ORAL_TABLET | Freq: Four times a day (QID) | ORAL | Status: DC | PRN

## 2022-07-06 MED ORDER — OXYCODONE HCL 5 MG PO TABS
10.0000 mg | ORAL_TABLET | ORAL | Status: DC | PRN
  Administered 2022-07-06 – 2022-07-07 (×3): 10 mg via ORAL
  Filled 2022-07-06 (×3): qty 2

## 2022-07-06 MED ORDER — PROPOFOL 10 MG/ML IV BOLUS
INTRAVENOUS | Status: DC | PRN
  Administered 2022-07-06: 200 mg via INTRAVENOUS

## 2022-07-06 SURGICAL SUPPLY — 78 items
ADH SKN CLS APL DERMABOND .7 (GAUZE/BANDAGES/DRESSINGS) ×1
APL SKNCLS STERI-STRIP NONHPOA (GAUZE/BANDAGES/DRESSINGS)
BAG COUNTER SPONGE SURGICOUNT (BAG) ×1 IMPLANT
BAG SPNG CNTER NS LX DISP (BAG) ×2
BAND INSRT 18 STRL LF DISP RB (MISCELLANEOUS) ×2
BAND RUBBER #18 3X1/16 STRL (MISCELLANEOUS) ×2 IMPLANT
BASKET BONE COLLECTION (BASKET) ×1 IMPLANT
BENZOIN TINCTURE PRP APPL 2/3 (GAUZE/BANDAGES/DRESSINGS) IMPLANT
BLADE CLIPPER SURG (BLADE) IMPLANT
BLADE SURG 11 STRL SS (BLADE) ×1 IMPLANT
BUR 14 MATCH 3 (BUR) IMPLANT
BUR MATCHSTICK NEURO 3.0 LAGG (BURR) ×1 IMPLANT
BUR MR8 14CM BALL SYMTRI 5 (BUR) IMPLANT
BUR PRECISION FLUTE 5.0 (BURR) ×1 IMPLANT
BURR 14 MATCH 3 (BUR) ×1
BURR MR8 14CM BALL SYMTRI 5 (BUR)
CANISTER SUCT 3000ML PPV (MISCELLANEOUS) ×1 IMPLANT
CNTNR URN SCR LID CUP LEK RST (MISCELLANEOUS) ×1 IMPLANT
CONT SPEC 4OZ STRL OR WHT (MISCELLANEOUS) ×1
COVER BACK TABLE 60X90IN (DRAPES) ×1 IMPLANT
COVERAGE SUPPORT O-ARM STEALTH (MISCELLANEOUS) ×1 IMPLANT
DERMABOND ADVANCED .7 DNX12 (GAUZE/BANDAGES/DRESSINGS) ×1 IMPLANT
DRAPE C-ARM 42X72 X-RAY (DRAPES) IMPLANT
DRAPE C-ARMOR (DRAPES) IMPLANT
DRAPE LAPAROTOMY 100X72X124 (DRAPES) ×1 IMPLANT
DRAPE MICROSCOPE SLANT 54X150 (MISCELLANEOUS) ×1 IMPLANT
DRAPE SHEET LG 3/4 BI-LAMINATE (DRAPES) ×4 IMPLANT
DRAPE SURG 17X23 STRL (DRAPES) ×1 IMPLANT
DURAPREP 26ML APPLICATOR (WOUND CARE) ×1 IMPLANT
ELECT REM PT RETURN 9FT ADLT (ELECTROSURGICAL) ×1
ELECTRODE REM PT RTRN 9FT ADLT (ELECTROSURGICAL) ×1 IMPLANT
FEE COVERAGE SUPPORT O-ARM (MISCELLANEOUS) ×1 IMPLANT
GAUZE 4X4 16PLY ~~LOC~~+RFID DBL (SPONGE) IMPLANT
GAUZE SPONGE 4X4 12PLY STRL (GAUZE/BANDAGES/DRESSINGS) IMPLANT
GLOVE BIOGEL PI IND STRL 7.5 (GLOVE) ×2 IMPLANT
GLOVE ECLIPSE 7.5 STRL STRAW (GLOVE) ×2 IMPLANT
GLOVE EXAM NITRILE LRG STRL (GLOVE) IMPLANT
GLOVE EXAM NITRILE XL STR (GLOVE) IMPLANT
GLOVE EXAM NITRILE XS STR PU (GLOVE) IMPLANT
GOWN STRL REUS W/ TWL LRG LVL3 (GOWN DISPOSABLE) ×4 IMPLANT
GOWN STRL REUS W/ TWL XL LVL3 (GOWN DISPOSABLE) IMPLANT
GOWN STRL REUS W/TWL 2XL LVL3 (GOWN DISPOSABLE) IMPLANT
GOWN STRL REUS W/TWL LRG LVL3 (GOWN DISPOSABLE) ×2
GOWN STRL REUS W/TWL XL LVL3 (GOWN DISPOSABLE) ×2
HEMOSTAT POWDER KIT SURGIFOAM (HEMOSTASIS) ×1 IMPLANT
KIT BASIN OR (CUSTOM PROCEDURE TRAY) ×1 IMPLANT
KIT POSITION SURG JACKSON T1 (MISCELLANEOUS) ×1 IMPLANT
KIT TURNOVER KIT B (KITS) ×1 IMPLANT
MARKER SPHERE PSV REFLC NDI (MISCELLANEOUS) ×5 IMPLANT
MILL BONE PREP (MISCELLANEOUS) IMPLANT
NDL HYPO 18GX1.5 BLUNT FILL (NEEDLE) IMPLANT
NDL SPNL 18GX3.5 QUINCKE PK (NEEDLE) IMPLANT
NEEDLE HYPO 18GX1.5 BLUNT FILL (NEEDLE) IMPLANT
NEEDLE HYPO 22GX1.5 SAFETY (NEEDLE) ×1 IMPLANT
NEEDLE SPNL 18GX3.5 QUINCKE PK (NEEDLE) IMPLANT
NS IRRIG 1000ML POUR BTL (IV SOLUTION) ×1 IMPLANT
PACK LAMINECTOMY NEURO (CUSTOM PROCEDURE TRAY) ×1 IMPLANT
PAD ARMBOARD 7.5X6 YLW CONV (MISCELLANEOUS) ×3 IMPLANT
PUTTY DBM GRAFTON 5CC (Putty) IMPLANT
ROD PREBENT 30MM LUMBAR (Rod) IMPLANT
SCREW 6.5X40 (Screw) IMPLANT
SCREW SET SOLERA (Screw) ×4 IMPLANT
SCREW SET SOLERA TI5.5 (Screw) IMPLANT
SCREW SOLERA 6.5X50 (Screw) IMPLANT
SPACER PL CATALYFT LONG 11 (Spacer) IMPLANT
SPIKE FLUID TRANSFER (MISCELLANEOUS) ×1 IMPLANT
SPONGE SURGIFOAM ABS GEL 100 (HEMOSTASIS) IMPLANT
SPONGE T-LAP 4X18 ~~LOC~~+RFID (SPONGE) IMPLANT
STRIP CLOSURE SKIN 1/2X4 (GAUZE/BANDAGES/DRESSINGS) IMPLANT
SUT MNCRL AB 3-0 PS2 18 (SUTURE) ×1 IMPLANT
SUT VIC AB 0 CT1 18XCR BRD8 (SUTURE) ×1 IMPLANT
SUT VIC AB 0 CT1 8-18 (SUTURE) ×1
SUT VIC AB 2-0 CP2 18 (SUTURE) ×1 IMPLANT
SYR 3ML LL SCALE MARK (SYRINGE) IMPLANT
TOWEL GREEN STERILE (TOWEL DISPOSABLE) ×1 IMPLANT
TOWEL GREEN STERILE FF (TOWEL DISPOSABLE) ×1 IMPLANT
TRAY FOLEY MTR SLVR 16FR STAT (SET/KITS/TRAYS/PACK) ×1 IMPLANT
WATER STERILE IRR 1000ML POUR (IV SOLUTION) ×1 IMPLANT

## 2022-07-06 NOTE — Anesthesia Procedure Notes (Signed)
Procedure Name: Intubation Date/Time: 07/06/2022 8:07 AM  Performed by: Janace Litten, CRNAPre-anesthesia Checklist: Patient identified, Emergency Drugs available, Suction available and Patient being monitored Patient Re-evaluated:Patient Re-evaluated prior to induction Oxygen Delivery Method: Circle System Utilized Preoxygenation: Pre-oxygenation with 100% oxygen Induction Type: IV induction Ventilation: Mask ventilation without difficulty Laryngoscope Size: Mac and 4 Grade View: Grade I Tube type: Oral Tube size: 7.5 mm Number of attempts: 1 Airway Equipment and Method: Stylet and Oral airway Placement Confirmation: ETT inserted through vocal cords under direct vision, positive ETCO2 and breath sounds checked- equal and bilateral Secured at: 22 cm Tube secured with: Tape Dental Injury: Teeth and Oropharynx as per pre-operative assessment

## 2022-07-06 NOTE — Anesthesia Postprocedure Evaluation (Signed)
Anesthesia Post Note  Patient: DOMANI BAKOS  Procedure(s) Performed: Lumbar four-five Open Laminectomy/Transforaminal Lumbar Interbody Fusion/posterolateral instrumented fusion Application of O-Arm     Patient location during evaluation: PACU Anesthesia Type: General Level of consciousness: awake Pain management: pain level controlled Vital Signs Assessment: post-procedure vital signs reviewed and stable Respiratory status: spontaneous breathing, nonlabored ventilation, respiratory function stable and patient connected to nasal cannula oxygen Cardiovascular status: blood pressure returned to baseline and stable Postop Assessment: no apparent nausea or vomiting Anesthetic complications: no   No notable events documented.  Last Vitals:  Vitals:   07/06/22 1315 07/06/22 1331  BP: 134/81 (!) 150/88  Pulse: 82 82  Resp: 11 20  Temp: 36.9 C 36.6 C  SpO2: 97% 99%    Last Pain:  Vitals:   07/06/22 1331  TempSrc: Oral  PainSc:                  Michaeleen Down P Elanora Quin

## 2022-07-06 NOTE — H&P (Signed)
Surgical H&P Update  HPI: 71 y.o. with a history of back and LLE pain and weakness. Workup showed multi-level disease, worst at L4-5. No changes in health since they were last seen. Still having the above and wishes to proceed with surgery.  PMHx:  Past Medical History:  Diagnosis Date   COVID 2020   Ingrown toenail 2021   MRSA bacteremia 2021   UTI (urinary tract infection) 10/2019   FamHx:  Family History  Problem Relation Age of Onset   Arthritis Mother    Cancer Brother    SocHx:  reports that he has quit smoking. His smoking use included cigarettes. He has never used smokeless tobacco. He reports current alcohol use of about 18.0 standard drinks of alcohol per week. He reports that he does not use drugs.  Physical Exam: Strength 5/5 x4 except left EHL/TA 3/5 and SILTx4   Assesment/Plan: 71 y.o. man with L4-5 spondylolisthesis, stenosis and foot drop, here for L4-5 decompression and PSIF. Risks, benefits, and alternatives discussed and the patient would like to continue with surgery.  -OR today -3C post-op  Judith Part, MD 07/06/22 7:43 AM

## 2022-07-06 NOTE — Op Note (Signed)
PATIENT: John Houston  DAY OF SURGERY: 07/06/22   PRE-OPERATIVE DIAGNOSIS:  Lumbar spondylolisthesis, lumbar radiculopathy   POST-OPERATIVE DIAGNOSIS:  Same   PROCEDURE:  L4-5 open laminectomy, transforaminal lumbar interbody fusion, and posterolateral instrumented fusion; use of intraoperative CT and frameless stereotactic navigation   SURGEON:  Surgeon(s) and Role:    Judith Part, MD - Primary    Ashok Pall, MD - Assisting   ANESTHESIA: ETGA   BRIEF HISTORY: This is a 71 year old man who presented with low back and LLE pain with a left foot drop. Workup showed stenosis worst at L4-5 with a spondylolisthesis at that level. Given the weakness, I recommended surgical intervention. This was discussed with the patient as well as risks, benefits, and alternatives and wished to proceed with surgery.   OPERATIVE DETAIL: The patient was taken to the operating room and anesthesia was induced by the anesthesia team. They were placed on the OR table in the prone position with padding of all pressure points. A formal time out was performed with two patient identifiers and confirmed the operative site. The operative site was marked, hair was clipped with surgical clippers, the area was then prepped and draped in a sterile fashion. Fluoro was used to localize the operative level and a midline incision was placed to expose from L4 to L5. Subperiosteal dissection was performed bilaterally and fluoroscopy was again used to confirm the surgical level.   A spinous process clamp was applied and secured, followed by attachment of a reference array. The field was draped and the O-arm was brought into the field. An intra-op CT was performed, sent to the Stealth navigation station, registered to the patient's anatomy, and confirmed with landmarks with acceptable fit. Stereotactic spinal navigation was utilized throughout the procedure for planning and placement of pedicle screw  trajectories.  Instrumentation was then performed. Stereotactic navigation was used to guide placement of bilateral pedicle screws (Medtronic) at L4 and L5. These were placed with navigated instruments by localizing the pedicle, drilling a pilot hole, cannulating the pedicle with an awl-tap, palpating for pedicle wall breaches, and then placing the screw. Pedicle screws were placed on the right, but the left sided pedicles were tapped and the screws were placed after the TLIF.   Decompression was performed, which consisted of a decompressive laminectomy at L4-5 followed by a complete left L4-5 facetectomy and foraminotomy. The L4-5 TLIF was then performed. The traversing root was identified and protected for the duration of disc work. A discectomy was performed, endplates were prepped, and an expandable titanium interbody (Medtronic) was placed, all with navigated instruments.   The left sided screws were then placed into the pre-tapped holes and the bilateral pedicle screws were connected with rods bilaterally and final tightened according to manufacturer torque specifications. A final cross table was taken to confirm good positioning. The bone was thoroughly decorticated over the facet and transverse processes on the right at L4-5 and the previously resected bone fragments were morselized and used as autograft to complete the L4-5 posterolateral fusion.   All instrument and sponge counts were correct, the incision was then closed in layers. The patient was then returned to anesthesia for emergence. No apparent complications at the completion of the procedure.   EBL:  269m   DRAINS: none   SPECIMENS: none   TJudith Part MD 07/06/22 8:16 AM

## 2022-07-06 NOTE — Progress Notes (Signed)
Clearence Cheek, daughter, took all of pt's. Belongings. Nothing went to PACU

## 2022-07-06 NOTE — Transfer of Care (Signed)
Immediate Anesthesia Transfer of Care Note  Patient: John Houston  Procedure(s) Performed: Lumbar four-five Open Laminectomy/Transforaminal Lumbar Interbody Fusion/posterolateral instrumented fusion Application of O-Arm  Patient Location: PACU  Anesthesia Type:General  Level of Consciousness: drowsy, patient cooperative and responds to stimulation  Airway & Oxygen Therapy: Patient Spontanous Breathing  Post-op Assessment: Report given to RN and Post -op Vital signs reviewed and stable  Post vital signs: Reviewed and stable  Last Vitals:  Vitals Value Taken Time  BP 134/82 07/06/22 1121  Temp 37.1 C 07/06/22 1120  Pulse 90 07/06/22 1123  Resp 18 07/06/22 1123  SpO2 97 % 07/06/22 1123  Vitals shown include unvalidated device data.  Last Pain:  Vitals:   07/06/22 0616  TempSrc:   PainSc: 0-No pain         Complications: No notable events documented.

## 2022-07-07 ENCOUNTER — Encounter (HOSPITAL_COMMUNITY): Payer: Self-pay | Admitting: Neurological Surgery

## 2022-07-07 DIAGNOSIS — M4316 Spondylolisthesis, lumbar region: Secondary | ICD-10-CM | POA: Diagnosis not present

## 2022-07-07 MED ORDER — CYCLOBENZAPRINE HCL 10 MG PO TABS: 10.0000 mg | ORAL_TABLET | Freq: Three times a day (TID) | ORAL | 0 refills | Status: AC | PRN

## 2022-07-07 MED ORDER — OXYCODONE HCL 5 MG PO TABS: 5.0000 mg | ORAL_TABLET | ORAL | 0 refills | Status: AC | PRN

## 2022-07-07 NOTE — Plan of Care (Signed)

## 2022-07-07 NOTE — Evaluation (Signed)
Occupational Therapy Evaluation Patient Details Name: John Houston MRN: [id number] DOB: 1951/07/09 Today's Date: 07/07/2022   History of Present Illness 71 yo M s/p PSIF.  No remarkable PMH, Covid.   Clinical Impression   Patient admitted for the diagnosis above.  PTA he lives alone, continues to work as a Dealer, and will have assist as needed from family.  Patient is presenting close to baseline, needs cues for back precautions, and continues to present with foot drop.  Precautions reviewed, precaution sheet reviewed, and all questions answered.  No further OT needs in the acute setting.        Recommendations for follow up therapy are one component of a multi-disciplinary discharge planning process, led by the attending physician.  Recommendations may be updated based on patient status, additional functional criteria and insurance authorization.   Follow Up Recommendations  No OT follow up    Assistance Recommended at Discharge PRN  Patient can return home with the following Assist for transportation    Functional Status Assessment  Patient has not had a recent decline in their functional status  Equipment Recommendations  None recommended by OT    Recommendations for Other Services       Precautions / Restrictions Precautions Precautions: Back Precaution Booklet Issued: Yes (comment) Precaution Comments: needing Min VC's Restrictions Weight Bearing Restrictions: No      Mobility Bed Mobility Overal bed mobility: Needs Assistance Bed Mobility: Sidelying to Sit, Sit to Sidelying   Sidelying to sit: Supervision     Sit to sidelying: Supervision      Transfers Overall transfer level: Needs assistance   Transfers: Sit to/from Stand Sit to Stand: Independent                  Balance Overall balance assessment: Mild deficits observed, not formally tested                                         ADL either performed or assessed with  clinical judgement   ADL Overall ADL's : Modified independent                                             Vision Baseline Vision/History: 0 No visual deficits Patient Visual Report: No change from baseline       Perception Perception Perception: Within Functional Limits   Praxis Praxis Praxis: Intact    Pertinent Vitals/Pain Pain Assessment Pain Assessment: Faces Faces Pain Scale: Hurts a little bit Pain Location: Incisional Pain Descriptors / Indicators: Sore Pain Intervention(s): Monitored during session     Hand Dominance Right   Extremity/Trunk Assessment Upper Extremity Assessment Upper Extremity Assessment: Overall WFL for tasks assessed   Lower Extremity Assessment Lower Extremity Assessment: Defer to PT evaluation   Cervical / Trunk Assessment Cervical / Trunk Assessment: Back Surgery   Communication Communication Communication: No difficulties   Cognition Arousal/Alertness: Awake/alert Behavior During Therapy: WFL for tasks assessed/performed Overall Cognitive Status: Within Functional Limits for tasks assessed                                       General Comments   VSS on RA    Exercises  Shoulder Instructions      Home Living Family/patient expects to be discharged to:: Private residence Living Arrangements: Alone Available Help at Discharge: Family;Available 24 hours/day Type of Home: House Home Access: Stairs to enter [employer] of Steps: 5 Entrance Stairs-Rails: Right;Left;Can reach both Home Layout: One level     Bathroom Shower/Tub: Tub/shower unit;Walk-in shower   Bathroom Toilet: Standard Bathroom Accessibility: Yes How Accessible: Accessible via walker Home Equipment: Shower seat          Prior Functioning/Environment Prior Level of Function : Independent/Modified Independent;Working/employed;Driving                        OT Problem List: Decreased knowledge  of precautions      OT Treatment/Interventions:      OT Goals(Current goals can be found in the care plan section) Acute Rehab OT Goals Patient Stated Goal: Return home OT Goal Formulation: With patient Time For Goal Achievement: 07/12/22 Potential to Achieve Goals: Good  OT Frequency:      Co-evaluation              AM-PAC OT "6 Clicks" Daily Activity     Outcome Measure Help from another person eating meals?: None Help from another person taking care of personal grooming?: None Help from another person toileting, which includes using toliet, bedpan, or urinal?: None Help from another person bathing (including washing, rinsing, drying)?: None Help from another person to put on and taking off regular upper body clothing?: None Help from another person to put on and taking off regular lower body clothing?: None 6 Click Score: 24   End of Session Nurse Communication: Mobility status  Activity Tolerance: Patient tolerated treatment well Patient left: in bed;with call bell/phone within reach  OT Visit Diagnosis: Unsteadiness on feet (R26.81);Muscle weakness (generalized) (M62.81)                Time: 3009-2330 OT Time Calculation (min): 21 min Charges:  OT General Charges $OT Visit: 1 Visit OT Evaluation $OT Eval Low Complexity: 1 Low  07/07/2022  RP, OTR/L  Acute Rehabilitation Services  Office:  [phone]   Metta Clines 07/07/2022, 8:37 AM

## 2022-07-07 NOTE — Discharge Summary (Signed)
Discharge Summary  Date of Admission: 07/06/2022  Date of Discharge: 07/07/22  Attending Physician: Emelda Brothers, MD  Hospital Course: Patient was admitted following an uncomplicated M4-6 open decompression and PSIF. They were recovered in PACU and transferred to [facility]. His preop pain was resolved, foot weakness/numbness was showing early improvement, his hospital course was uncomplicated and the patient was discharged home on 07/07/22. They will follow up in clinic with me in clinic in 2 weeks.  Neurologic exam at discharge:  Strength 5/5 x4 and SILTx4 except stable preop LLE foot numbness and TA/EHL weakness  Discharge diagnosis: Lumbar radiculopathy, Lumbar spondylolisthesis  Judith Part, MD 07/07/22 8:56 AM

## 2022-07-07 NOTE — Progress Notes (Signed)
Neurosurgery Service Progress Note  Subjective: No acute events overnight, LLE numbness improved, radicular pain resolved, he's very happy   Objective: Vitals:   07/06/22 1947 07/06/22 2311 07/07/22 0420 07/07/22 0751  BP: 124/76 127/89 129/83 122/82  Pulse: 82 72 81 83  Resp: '18 20 18   '$ Temp: 99 F (37.2 C) 98 F (36.7 C) 98.2 F (36.8 C) 98.5 F (36.9 C)  TempSrc: Oral Oral Oral   SpO2: 97% 97% 99% 98%  Weight:      Height:        Physical Exam: Strength 5/5 x4 and SILTx4 except stable L EHL/TA weakness and improving L foot numbness  Assessment & Plan: 71 y.o. man s/p L4-5 lami/fusion, recovering well.  -discharge home today  Judith Part  07/07/22 8:53 AM

## 2022-07-07 NOTE — Progress Notes (Signed)
Patient alert and oriented, mae's well, voiding adequate amount of urine, swallowing without difficulty, no c/o pain at time of discharge. Patient discharged home with family. Script and discharged instructions given to patient. Patient and family stated understanding of instructions given. Patient has an appointment with Dr. Zada Finders in 2 weeks

## 2022-07-07 NOTE — Evaluation (Addendum)
Physical Therapy Evaluation & Discharge  Patient Details Name: John Houston MRN: [id number] DOB: Jul 30, 1951 Today's Date: 07/07/2022  History of Present Illness  71 yo M s/pL4-5 PSIF on 07/06/22.  No remarkable PMH.  Clinical Impression  Patient admitted following the above procedure. Patient will have assistance at home initially. Patient typically independent and working. Overall, he is functioning at modI level for mobility with no AD. Requires cues to recall log roll technique and maintain back precautions during familiar tasks. Educated patient on back precautions, progressive walking program, and car transfer, patient verbalized understanding. No further skilled PT needs identified acutely. No PT follow up recommended at this time. Would benefit from OPPT once cleared by MD to address L foot drop and associated gait changes.         Recommendations for follow up therapy are one component of a multi-disciplinary discharge planning process, led by the attending physician.  Recommendations may be updated based on patient status, additional functional criteria and insurance authorization.  Follow Up Recommendations No PT follow up      Assistance Recommended at Discharge PRN  Patient can return home with the following  Assistance with cooking/housework;Assist for transportation    Equipment Recommendations None recommended by PT  Recommendations for Other Services       Functional Status Assessment Patient has had a recent decline in their functional status and demonstrates the ability to make significant improvements in function in a reasonable and predictable amount of time.     Precautions / Restrictions Precautions Precautions: Back Precaution Booklet Issued: Yes (comment) Precaution Comments: needing Min VC's Restrictions Weight Bearing Restrictions: No      Mobility  Bed Mobility Overal bed mobility: Needs Assistance Bed Mobility: Sidelying to Sit, Sit to  Sidelying   Sidelying to sit: Supervision     Sit to sidelying: Supervision General bed mobility comments: cues for log roll technique    Transfers Overall transfer level: Modified independent Equipment used: None                    Ambulation/Gait Ambulation/Gait assistance: Modified independent (Device/Increase time) Gait Distance (Feet): 300 Feet Assistive device: None Gait Pattern/deviations: Decreased dorsiflexion - left Gait velocity: decreased     General Gait Details: supinated gait pattern on L with decreased DF. Patient states this is typical in past 2 months  Stairs Stairs: Yes Stairs assistance: Modified independent (Device/Increase time) Stair Management: Two rails, Alternating pattern, Forwards Number of Stairs: 5    Wheelchair Mobility    Modified Rankin (Stroke Patients Only)       Balance Overall balance assessment: Mild deficits observed, not formally tested                                           Pertinent Vitals/Pain Pain Assessment Pain Assessment: Faces Faces Pain Scale: Hurts a little bit Pain Location: Incisional Pain Descriptors / Indicators: Sore Pain Intervention(s): Monitored during session    Home Living Family/patient expects to be discharged to:: Private residence Living Arrangements: Alone Available Help at Discharge: Family;Available 24 hours/day Type of Home: House Home Access: Stairs to enter Entrance Stairs-Rails: Right;Left;Can reach both Entrance Stairs-Number of Steps: 5   Home Layout: One level Home Equipment: Shower seat      Prior Function Prior Level of Function : Independent/Modified Independent;Working/employed;Driving  Hand Dominance   Dominant Hand: Right    Extremity/Trunk Assessment   Upper Extremity Assessment Upper Extremity Assessment: Defer to OT evaluation    Lower Extremity Assessment Lower Extremity Assessment: Generalized  weakness;LLE deficits/detail LLE Deficits / Details: L foot drop x 2 months but does demonstrate 2-/5 strength    Cervical / Trunk Assessment Cervical / Trunk Assessment: Back Surgery  Communication   Communication: No difficulties  Cognition Arousal/Alertness: Awake/alert Behavior During Therapy: WFL for tasks assessed/performed Overall Cognitive Status: Within Functional Limits for tasks assessed                                          General Comments      Exercises     Assessment/Plan    PT Assessment Patient does not need any further PT services  PT Problem List         PT Treatment Interventions      PT Goals (Current goals can be found in the Care Plan section)  Acute Rehab PT Goals Patient Stated Goal: to go home PT Goal Formulation: All assessment and education complete, DC therapy    Frequency       Co-evaluation               AM-PAC PT "6 Clicks" Mobility  Outcome Measure Help needed turning from your back to your side while in a flat bed without using bedrails?: A Little Help needed moving from lying on your back to sitting on the side of a flat bed without using bedrails?: A Little Help needed moving to and from a bed to a chair (including a wheelchair)?: None Help needed standing up from a chair using your arms (e.g., wheelchair or bedside chair)?: None Help needed to walk in hospital room?: None Help needed climbing 3-5 steps with a railing? : None 6 Click Score: 22    End of Session   Activity Tolerance: Patient tolerated treatment well Patient left: in bed;with call bell/phone within reach Nurse Communication: Mobility status PT Visit Diagnosis: Muscle weakness (generalized) (M62.81)    Time: 6063-0160 PT Time Calculation (min) (ACUTE ONLY): 15 min   Charges:   PT Evaluation $PT Eval Low Complexity: 1 Low          Ernesta Trabert A. Gilford Rile PT, DPT Acute Rehabilitation Services Office [phone]   Linna Hoff 07/07/2022, 9:45 AM

## 2022-10-06 DIAGNOSIS — M5412 Radiculopathy, cervical region: Secondary | ICD-10-CM | POA: Diagnosis not present

## 2022-10-06 DIAGNOSIS — M4326 Fusion of spine, lumbar region: Secondary | ICD-10-CM | POA: Diagnosis not present

## 2022-10-06 DIAGNOSIS — Z6825 Body mass index (BMI) 25.0-25.9, adult: Secondary | ICD-10-CM | POA: Diagnosis not present

## 2022-10-19 DIAGNOSIS — M5030 Other cervical disc degeneration, unspecified cervical region: Secondary | ICD-10-CM | POA: Diagnosis not present

## 2022-10-19 DIAGNOSIS — M5020 Other cervical disc displacement, unspecified cervical region: Secondary | ICD-10-CM | POA: Diagnosis not present

## 2022-10-19 DIAGNOSIS — M50123 Cervical disc disorder at C6-C7 level with radiculopathy: Secondary | ICD-10-CM | POA: Diagnosis not present

## 2022-10-22 DIAGNOSIS — Z6825 Body mass index (BMI) 25.0-25.9, adult: Secondary | ICD-10-CM | POA: Diagnosis not present

## 2022-10-22 DIAGNOSIS — M5412 Radiculopathy, cervical region: Secondary | ICD-10-CM | POA: Diagnosis not present

## 2023-03-07 DIAGNOSIS — N401 Enlarged prostate with lower urinary tract symptoms: Secondary | ICD-10-CM | POA: Diagnosis not present

## 2023-03-07 DIAGNOSIS — E785 Hyperlipidemia, unspecified: Secondary | ICD-10-CM | POA: Diagnosis not present

## 2023-03-07 DIAGNOSIS — R7301 Impaired fasting glucose: Secondary | ICD-10-CM | POA: Diagnosis not present

## 2023-03-07 DIAGNOSIS — R7989 Other specified abnormal findings of blood chemistry: Secondary | ICD-10-CM | POA: Diagnosis not present

## 2023-03-07 DIAGNOSIS — M109 Gout, unspecified: Secondary | ICD-10-CM | POA: Diagnosis not present

## 2023-03-14 DIAGNOSIS — Z1389 Encounter for screening for other disorder: Secondary | ICD-10-CM | POA: Diagnosis not present

## 2023-03-14 DIAGNOSIS — N401 Enlarged prostate with lower urinary tract symptoms: Secondary | ICD-10-CM | POA: Diagnosis not present

## 2023-03-14 DIAGNOSIS — R030 Elevated blood-pressure reading, without diagnosis of hypertension: Secondary | ICD-10-CM | POA: Diagnosis not present

## 2023-03-14 DIAGNOSIS — B379 Candidiasis, unspecified: Secondary | ICD-10-CM | POA: Diagnosis not present

## 2023-03-14 DIAGNOSIS — E785 Hyperlipidemia, unspecified: Secondary | ICD-10-CM | POA: Diagnosis not present

## 2023-03-14 DIAGNOSIS — I2510 Atherosclerotic heart disease of native coronary artery without angina pectoris: Secondary | ICD-10-CM | POA: Diagnosis not present

## 2023-03-14 DIAGNOSIS — M109 Gout, unspecified: Secondary | ICD-10-CM | POA: Diagnosis not present

## 2023-03-14 DIAGNOSIS — Z0000 Encounter for general adult medical examination without abnormal findings: Secondary | ICD-10-CM | POA: Diagnosis not present

## 2023-03-14 DIAGNOSIS — M48061 Spinal stenosis, lumbar region without neurogenic claudication: Secondary | ICD-10-CM | POA: Diagnosis not present

## 2023-03-14 DIAGNOSIS — L600 Ingrowing nail: Secondary | ICD-10-CM | POA: Diagnosis not present

## 2023-03-14 DIAGNOSIS — Z658 Other specified problems related to psychosocial circumstances: Secondary | ICD-10-CM | POA: Diagnosis not present

## 2023-03-14 DIAGNOSIS — B009 Herpesviral infection, unspecified: Secondary | ICD-10-CM | POA: Diagnosis not present

## 2023-03-14 DIAGNOSIS — R7301 Impaired fasting glucose: Secondary | ICD-10-CM | POA: Diagnosis not present

## 2023-03-14 DIAGNOSIS — D696 Thrombocytopenia, unspecified: Secondary | ICD-10-CM | POA: Diagnosis not present

## 2023-03-14 DIAGNOSIS — F4320 Adjustment disorder, unspecified: Secondary | ICD-10-CM | POA: Diagnosis not present

## 2023-03-14 DIAGNOSIS — R82998 Other abnormal findings in urine: Secondary | ICD-10-CM | POA: Diagnosis not present

## 2023-03-14 DIAGNOSIS — K219 Gastro-esophageal reflux disease without esophagitis: Secondary | ICD-10-CM | POA: Diagnosis not present

## 2023-03-14 DIAGNOSIS — A4902 Methicillin resistant Staphylococcus aureus infection, unspecified site: Secondary | ICD-10-CM | POA: Diagnosis not present

## 2023-03-14 DIAGNOSIS — I700 Atherosclerosis of aorta: Secondary | ICD-10-CM | POA: Diagnosis not present

## 2023-05-18 DIAGNOSIS — R21 Rash and other nonspecific skin eruption: Secondary | ICD-10-CM | POA: Diagnosis not present

## 2023-05-18 DIAGNOSIS — R030 Elevated blood-pressure reading, without diagnosis of hypertension: Secondary | ICD-10-CM | POA: Diagnosis not present

## 2024-05-16 DIAGNOSIS — Z1211 Encounter for screening for malignant neoplasm of colon: Secondary | ICD-10-CM | POA: Diagnosis not present

## 2024-06-11 DIAGNOSIS — E785 Hyperlipidemia, unspecified: Secondary | ICD-10-CM | POA: Diagnosis not present

## 2024-06-11 DIAGNOSIS — R7301 Impaired fasting glucose: Secondary | ICD-10-CM | POA: Diagnosis not present

## 2024-06-11 DIAGNOSIS — I2510 Atherosclerotic heart disease of native coronary artery without angina pectoris: Secondary | ICD-10-CM | POA: Diagnosis not present

## 2024-06-11 DIAGNOSIS — Z1289 Encounter for screening for malignant neoplasm of other sites: Secondary | ICD-10-CM | POA: Diagnosis not present

## 2024-06-11 DIAGNOSIS — Z1212 Encounter for screening for malignant neoplasm of rectum: Secondary | ICD-10-CM | POA: Diagnosis not present

## 2024-06-11 DIAGNOSIS — Z0189 Encounter for other specified special examinations: Secondary | ICD-10-CM | POA: Diagnosis not present

## 2024-06-11 DIAGNOSIS — M109 Gout, unspecified: Secondary | ICD-10-CM | POA: Diagnosis not present

## 2024-06-18 DIAGNOSIS — I700 Atherosclerosis of aorta: Secondary | ICD-10-CM | POA: Diagnosis not present

## 2024-06-18 DIAGNOSIS — Z0000 Encounter for general adult medical examination without abnormal findings: Secondary | ICD-10-CM | POA: Diagnosis not present

## 2024-06-18 DIAGNOSIS — Z1331 Encounter for screening for depression: Secondary | ICD-10-CM | POA: Diagnosis not present

## 2024-06-18 DIAGNOSIS — F4320 Adjustment disorder, unspecified: Secondary | ICD-10-CM | POA: Diagnosis not present

## 2024-06-18 DIAGNOSIS — M48061 Spinal stenosis, lumbar region without neurogenic claudication: Secondary | ICD-10-CM | POA: Diagnosis not present

## 2024-06-18 DIAGNOSIS — R82998 Other abnormal findings in urine: Secondary | ICD-10-CM | POA: Diagnosis not present

## 2024-06-18 DIAGNOSIS — R7301 Impaired fasting glucose: Secondary | ICD-10-CM | POA: Diagnosis not present

## 2024-06-18 DIAGNOSIS — M109 Gout, unspecified: Secondary | ICD-10-CM | POA: Diagnosis not present

## 2024-06-18 DIAGNOSIS — B379 Candidiasis, unspecified: Secondary | ICD-10-CM | POA: Diagnosis not present

## 2024-06-18 DIAGNOSIS — I2510 Atherosclerotic heart disease of native coronary artery without angina pectoris: Secondary | ICD-10-CM | POA: Diagnosis not present

## 2024-06-18 DIAGNOSIS — N401 Enlarged prostate with lower urinary tract symptoms: Secondary | ICD-10-CM | POA: Diagnosis not present

## 2024-06-18 DIAGNOSIS — E785 Hyperlipidemia, unspecified: Secondary | ICD-10-CM | POA: Diagnosis not present
